# Patient Record
Sex: Female | Born: 1985 | Race: White | Hispanic: No | Marital: Married | Smoking: Former smoker
Health system: Southern US, Community
[De-identification: ages and names within clinical notes are randomized; demographics above are authoritative.]

## PROBLEM LIST (undated history)

## (undated) DIAGNOSIS — Z789 Other specified health status: Secondary | ICD-10-CM

## (undated) HISTORY — PX: KNEE ARTHROSCOPY: SHX127

## (undated) HISTORY — PX: FOOT SURGERY: SHX648

---

## 2006-08-28 ENCOUNTER — Inpatient Hospital Stay: Payer: Self-pay

## 2007-08-08 ENCOUNTER — Emergency Department: Payer: Self-pay | Admitting: Emergency Medicine

## 2008-02-18 ENCOUNTER — Emergency Department: Payer: Self-pay | Admitting: Emergency Medicine

## 2008-05-25 ENCOUNTER — Emergency Department: Payer: Self-pay | Admitting: Emergency Medicine

## 2009-09-13 ENCOUNTER — Emergency Department: Payer: Self-pay | Admitting: Emergency Medicine

## 2009-12-17 ENCOUNTER — Ambulatory Visit: Payer: Self-pay | Admitting: Family Medicine

## 2010-01-14 ENCOUNTER — Encounter: Payer: Self-pay | Admitting: Family Medicine

## 2010-01-18 ENCOUNTER — Encounter: Payer: Self-pay | Admitting: Family Medicine

## 2010-02-17 ENCOUNTER — Encounter: Payer: Self-pay | Admitting: Family Medicine

## 2011-01-03 ENCOUNTER — Ambulatory Visit: Payer: Self-pay | Admitting: Unknown Physician Specialty

## 2011-04-27 ENCOUNTER — Emergency Department: Payer: Self-pay | Admitting: Emergency Medicine

## 2011-05-19 ENCOUNTER — Emergency Department: Payer: Self-pay | Admitting: *Deleted

## 2012-02-26 ENCOUNTER — Emergency Department: Payer: Self-pay | Admitting: *Deleted

## 2013-07-12 ENCOUNTER — Emergency Department: Payer: Self-pay | Admitting: Internal Medicine

## 2013-10-20 NOTE — L&D Delivery Note (Signed)
Delivery Note At 10:36 AM a viable female was delivered via  (Presentation:OP ).  APGAR: 9,9 ; weight pending .   Placenta status: intact spontaneous, no signs of abruption at initial review. .  Cord:  with the following complications: none .  Cord pH: pending  Anesthesia: Epidural  Episiotomy: None Lacerations: None Suture Repair: N/A Est. Blood Loss (mL): 400cc  Mom to postpartum.  Baby to Couplet care / Skin to Skin. Called to delivery. Mother pushed over 5 min. Infant delivered to maternal abdomen. Delayed cord clamping performed. Cord clamped and cut. Active management of 3rd stage with traction and Pitocin. Cord gas drawn. Placenta delivered intact with 3v cord. No tears. EBL 400cc. Counts correct. Hemostatic.    [place], Hillery HunterCaleb G 05/26/2014, 10:52 AM  Evaluation and management procedures were performed by Resident physician under my supervision/collaboration. Chart reviewed, patient examined by me and I agree with management and plan. Cord pHa 7.27 Danae OrleansDeirdre C Agnes Probert, CNM 05/26/2014 1:11 PM

## 2014-01-29 ENCOUNTER — Observation Stay: Payer: Self-pay

## 2014-01-29 LAB — URINALYSIS, COMPLETE
Bilirubin,UR: NEGATIVE
Glucose,UR: NEGATIVE mg/dL (ref 0–75)
Ketone: NEGATIVE
Leukocyte Esterase: NEGATIVE
Nitrite: NEGATIVE
Ph: 6 (ref 4.5–8.0)
Protein: NEGATIVE
RBC,UR: 1 /HPF (ref 0–5)
Specific Gravity: 1.004 (ref 1.003–1.030)
Squamous Epithelial: 1
WBC UR: NONE SEEN /HPF (ref 0–5)

## 2014-01-31 ENCOUNTER — Ambulatory Visit: Payer: Self-pay | Admitting: Obstetrics and Gynecology

## 2014-03-12 ENCOUNTER — Observation Stay: Payer: Self-pay

## 2014-03-12 LAB — URINALYSIS, COMPLETE
BLOOD: NEGATIVE
Bacteria: NONE SEEN
Bilirubin,UR: NEGATIVE
Glucose,UR: NEGATIVE mg/dL (ref 0–75)
Ketone: NEGATIVE
NITRITE: NEGATIVE
Ph: 7 (ref 4.5–8.0)
Protein: NEGATIVE
RBC, UR: NONE SEEN /HPF (ref 0–5)
Specific Gravity: 1.005 (ref 1.003–1.030)
WBC UR: <1 /HPF (ref 0–5)

## 2014-03-12 LAB — FETAL FIBRONECTIN
Appearance: NORMAL
FETAL FIBRONECTIN: NEGATIVE

## 2014-03-23 ENCOUNTER — Observation Stay: Payer: Self-pay

## 2014-03-23 LAB — URINALYSIS, COMPLETE
Bacteria: NONE SEEN
Bilirubin,UR: NEGATIVE
Blood: NEGATIVE
Glucose,UR: NEGATIVE mg/dL (ref 0–75)
Ketone: NEGATIVE
NITRITE: NEGATIVE
PROTEIN: NEGATIVE
Ph: 6 (ref 4.5–8.0)
RBC, UR: NONE SEEN /HPF (ref 0–5)
Specific Gravity: 1.011 (ref 1.003–1.030)
Squamous Epithelial: 1
WBC UR: 1 /HPF (ref 0–5)

## 2014-03-27 ENCOUNTER — Ambulatory Visit (INDEPENDENT_AMBULATORY_CARE_PROVIDER_SITE_OTHER): Payer: 59 | Admitting: Family Medicine

## 2014-03-27 ENCOUNTER — Encounter: Payer: Self-pay | Admitting: Family Medicine

## 2014-03-27 VITALS — BP 103/78 | HR 70 | Ht 67.0 in | Wt 173.0 lb

## 2014-03-27 DIAGNOSIS — Z3480 Encounter for supervision of other normal pregnancy, unspecified trimester: Secondary | ICD-10-CM

## 2014-03-27 DIAGNOSIS — O4410 Placenta previa with hemorrhage, unspecified trimester: Secondary | ICD-10-CM

## 2014-03-27 DIAGNOSIS — O4453 Low lying placenta with hemorrhage, third trimester: Secondary | ICD-10-CM | POA: Insufficient documentation

## 2014-03-27 NOTE — Progress Notes (Signed)
Transfer from [facility]. Has had regular PNC there.  Has low-lying placenta with primary bleed with intial hospitalization. F/u U/s noted placenta remains low-lying (0.6-->0.88).  No further bleeding.  On bedrest, but then allowed to return to work--but struggling to keep up.  Needs note to stay out of work.  Considering returning to light duty in a few weeks. Indications for primary Cesarean delivery discussed--pt. Strongly desires vaginal delivery is possible. Also, recently seen with Preterm contractions, with negative FFN but concerned about further bleeding and possible need for continuous hospitalization. Desires BTL if Cesarean is necessary/needs to sign papers.

## 2014-03-27 NOTE — Patient Instructions (Addendum)
Third Trimester of Pregnancy The third trimester is from week 29 through week 42, months 7 through 9. The third trimester is a time when the fetus is growing rapidly. At the end of the ninth month, the fetus is about 20 inches in length and weighs 6 10 pounds.  BODY CHANGES Your body goes through many changes during pregnancy. The changes vary from woman to woman.   Your weight will continue to increase. You can expect to gain 25 35 pounds (11 16 kg) by the end of the pregnancy.  You may begin to get stretch marks on your hips, abdomen, and breasts.  You may urinate more often because the fetus is moving lower into your pelvis and pressing on your bladder.  You may develop or continue to have heartburn as a result of your pregnancy.  You may develop constipation because certain hormones are causing the muscles that push waste through your intestines to slow down.  You may develop hemorrhoids or swollen, bulging veins (varicose veins).  You may have pelvic pain because of the weight gain and pregnancy hormones relaxing your joints between the bones in your pelvis. Back aches may result from over exertion of the muscles supporting your posture.  Your breasts will continue to grow and be tender. A yellow discharge may leak from your breasts called colostrum.  Your belly button may stick out.  You may feel short of breath because of your expanding uterus.  You may notice the fetus "dropping," or moving lower in your abdomen.  You may have a bloody mucus discharge. This usually occurs a few days to a week before labor begins.  Your cervix becomes thin and soft (effaced) near your due date. WHAT TO EXPECT AT YOUR PRENATAL EXAMS  You will have prenatal exams every 2 weeks until week 36. Then, you will have weekly prenatal exams. During a routine prenatal visit:  You will be weighed to make sure you and the fetus are growing normally.  Your blood pressure is taken.  Your abdomen will  be measured to track your baby's growth.  The fetal heartbeat will be listened to.  Any test results from the previous visit will be discussed.  You may have a cervical check near your due date to see if you have effaced. At around 36 weeks, your caregiver will check your cervix. At the same time, your caregiver will also perform a test on the secretions of the vaginal tissue. This test is to determine if a type of bacteria, Group B streptococcus, is present. Your caregiver will explain this further. Your caregiver may ask you:  What your birth plan is.  How you are feeling.  If you are feeling the baby move.  If you have had any abnormal symptoms, such as leaking fluid, bleeding, severe headaches, or abdominal cramping.  If you have any questions. Other tests or screenings that may be performed during your third trimester include:  Blood tests that check for low iron levels (anemia).  Fetal testing to check the health, activity level, and growth of the fetus. Testing is done if you have certain medical conditions or if there are problems during the pregnancy. FALSE LABOR You may feel small, irregular contractions that eventually go away. These are called Braxton Hicks contractions, or false labor. Contractions may last for hours, days, or even weeks before true labor sets in. If contractions come at regular intervals, intensify, or become painful, it is best to be seen by your caregiver.    SIGNS OF LABOR   Menstrual-like cramps.  Contractions that are 5 minutes apart or less.  Contractions that start on the top of the uterus and spread down to the lower abdomen and back.  A sense of increased pelvic pressure or back pain.  A watery or bloody mucus discharge that comes from the vagina. If you have any of these signs before the 37th week of pregnancy, call your caregiver right away. You need to go to the hospital to get checked immediately. HOME CARE INSTRUCTIONS   Avoid all  smoking, herbs, alcohol, and unprescribed drugs. These chemicals affect the formation and growth of the baby.  Follow your caregiver's instructions regarding medicine use. There are medicines that are either safe or unsafe to take during pregnancy.  Exercise only as directed by your caregiver. Experiencing uterine cramps is a good sign to stop exercising.  Continue to eat regular, healthy meals.  Wear a good support bra for breast tenderness.  Do not use hot tubs, steam rooms, or saunas.  Wear your seat belt at all times when driving.  Avoid raw meat, uncooked cheese, cat litter boxes, and soil used by cats. These carry germs that can cause birth defects in the baby.  Take your prenatal vitamins.  Try taking a stool softener (if your caregiver approves) if you develop constipation. Eat more high-fiber foods, such as fresh vegetables or fruit and whole grains. Drink plenty of fluids to keep your urine clear or pale yellow.  Take warm sitz baths to soothe any pain or discomfort caused by hemorrhoids. Use hemorrhoid cream if your caregiver approves.  If you develop varicose veins, wear support hose. Elevate your feet for 15 minutes, 3 4 times a day. Limit salt in your diet.  Avoid heavy lifting, wear low heal shoes, and practice good posture.  Rest a lot with your legs elevated if you have leg cramps or low back pain.  Visit your dentist if you have not gone during your pregnancy. Use a soft toothbrush to brush your teeth and be gentle when you floss.  A sexual relationship may be continued unless your caregiver directs you otherwise.  Do not travel far distances unless it is absolutely necessary and only with the approval of your caregiver.  Take prenatal classes to understand, practice, and ask questions about the labor and delivery.  Make a trial run to the hospital.  Pack your hospital bag.  Prepare the baby's nursery.  Continue to go to all your prenatal visits as directed  by your caregiver. SEEK MEDICAL CARE IF:  You are unsure if you are in labor or if your water has broken.  You have dizziness.  You have mild pelvic cramps, pelvic pressure, or nagging pain in your abdominal area.  You have persistent nausea, vomiting, or diarrhea.  You have a bad smelling vaginal discharge.  You have pain with urination. SEEK IMMEDIATE MEDICAL CARE IF:   You have a fever.  You are leaking fluid from your vagina.  You have spotting or bleeding from your vagina.  You have severe abdominal cramping or pain.  You have rapid weight loss or gain.  You have shortness of breath with chest pain.  You notice sudden or extreme swelling of your face, hands, ankles, feet, or legs.  You have not felt your baby move in over an hour.  You have severe headaches that do not go away with medicine.  You have vision changes. Document Released: 09/30/2001 Document Revised: 06/08/2013 Document Reviewed:   12/07/2012 [employer] Patient Information 2014 [employer].  Breastfeeding Deciding to breastfeed is one of the best choices you can make for you and your baby. A change in hormones during pregnancy causes your breast tissue to grow and increases the number and size of your milk ducts. These hormones also allow proteins, sugars, and fats from your blood supply to make breast milk in your milk-producing glands. Hormones prevent breast milk from being released before your baby is born as well as prompt milk flow after birth. Once breastfeeding has begun, thoughts of your baby, as well as his or her sucking or crying, can stimulate the release of milk from your milk-producing glands.  BENEFITS OF BREASTFEEDING For Your Baby  Your first milk (colostrum) helps your baby's digestive system function better.   There are antibodies in your milk that help your baby fight off infections.   Your baby has a lower incidence of asthma, allergies, and sudden infant death syndrome.    The nutrients in breast milk are better for your baby than infant formulas and are designed uniquely for your baby's needs.   Breast milk improves your baby's brain development.   Your baby is less likely to develop other conditions, such as childhood obesity, asthma, or type 2 diabetes mellitus.  For You   Breastfeeding helps to create a very special bond between you and your baby.   Breastfeeding is convenient. Breast milk is always available at the correct temperature and costs nothing.   Breastfeeding helps to burn calories and helps you lose the weight gained during pregnancy.   Breastfeeding makes your uterus contract to its prepregnancy size faster and slows bleeding (lochia) after you give birth.   Breastfeeding helps to lower your risk of developing type 2 diabetes mellitus, osteoporosis, and breast or ovarian cancer later in life. SIGNS THAT YOUR BABY IS HUNGRY Early Signs of Hunger  Increased alertness or activity.  Stretching.  Movement of the head from side to side.  Movement of the head and opening of the mouth when the corner of the mouth or cheek is stroked (rooting).  Increased sucking sounds, smacking lips, cooing, sighing, or squeaking.  Hand-to-mouth movements.  Increased sucking of fingers or hands. Late Signs of Hunger  Fussing.  Intermittent crying. Extreme Signs of Hunger Signs of extreme hunger will require calming and consoling before your baby will be able to breastfeed successfully. Do not wait for the following signs of extreme hunger to occur before you initiate breastfeeding:   Restlessness.  A loud, strong cry.   Screaming. BREASTFEEDING BASICS Breastfeeding Initiation  Find a comfortable place to sit or lie down, with your neck and back well supported.  Place a pillow or rolled up blanket under your baby to bring him or her to the level of your breast (if you are seated). Nursing pillows are specially designed to help  support your arms and your baby while you breastfeed.  Make sure that your baby's abdomen is facing your abdomen.   Gently massage your breast. With your fingertips, massage from your chest wall toward your nipple in a circular motion. This encourages milk flow. You may need to continue this action during the feeding if your milk flows slowly.  Support your breast with 4 fingers underneath and your thumb above your nipple. Make sure your fingers are well away from your nipple and your baby's mouth.   Stroke your baby's lips gently with your finger or nipple.   When your baby's mouth is   open wide enough, quickly bring your baby to your breast, placing your entire nipple and as much of the colored area around your nipple (areola) as possible into your baby's mouth.   More areola should be visible above your baby's upper lip than below the lower lip.   Your baby's tongue should be between his or her lower gum and your breast.   Ensure that your baby's mouth is correctly positioned around your nipple (latched). Your baby's lips should create a seal on your breast and be turned out (everted).  It is common for your baby to suck about 2 3 minutes in order to start the flow of breast milk. Latching Teaching your baby how to latch on to your breast properly is very important. An improper latch can cause nipple pain and decreased milk supply for you and poor weight gain in your baby. Also, if your baby is not latched onto your nipple properly, he or she may swallow some air during feeding. This can make your baby fussy. Burping your baby when you switch breasts during the feeding can help to get rid of the air. However, teaching your baby to latch on properly is still the best way to prevent fussiness from swallowing air while breastfeeding. Signs that your baby has successfully latched on to your nipple:    Silent tugging or silent sucking, without causing you pain.   Swallowing heard  between every 3 4 sucks.    Muscle movement above and in front of his or her ears while sucking.  Signs that your baby has not successfully latched on to nipple:   Sucking sounds or smacking sounds from your baby while breastfeeding.  Nipple pain. If you think your baby has not latched on correctly, slip your finger into the corner of your baby's mouth to break the suction and place it between your baby's gums. Attempt breastfeeding initiation again. Signs of Successful Breastfeeding Signs from your baby:   A gradual decrease in the number of sucks or complete cessation of sucking.   Falling asleep.   Relaxation of his or her body.   Retention of a small amount of milk in his or her mouth.   Letting go of your breast by himself or herself. Signs from you:  Breasts that have increased in firmness, weight, and size 1 3 hours after feeding.   Breasts that are softer immediately after breastfeeding.  Increased milk volume, as well as a change in milk consistency and color by the 5th day of breastfeeding.   Nipples that are not sore, cracked, or bleeding. Signs That Your Baby is Getting Enough Milk  Wetting at least 3 diapers in a 24-hour period. The urine should be clear and pale yellow by age 5 days.  At least 3 stools in a 24-hour period by age 5 days. The stool should be soft and yellow.  At least 3 stools in a 24-hour period by age 7 days. The stool should be seedy and yellow.  No loss of weight greater than 10% of birth weight during the first 3 days of age.  Average weight gain of 4 7 ounces (120 210 mL) per week after age 4 days.  Consistent daily weight gain by age 5 days, without weight loss after the age of 2 weeks. After a feeding, your baby may spit up a small amount. This is common. BREASTFEEDING FREQUENCY AND DURATION Frequent feeding will help you make more milk and can prevent sore nipples and breast engorgement.   Breastfeed when you feel the need to  reduce the fullness of your breasts or when your baby shows signs of hunger. This is called "breastfeeding on demand." Avoid introducing a pacifier to your baby while you are working to establish breastfeeding (the first 4 6 weeks after your baby is born). After this time you may choose to use a pacifier. Research has shown that pacifier use during the first year of a baby's life decreases the risk of sudden infant death syndrome (SIDS). Allow your baby to feed on each breast as long as he or she wants. Breastfeed until your baby is finished feeding. When your baby unlatches or falls asleep while feeding from the first breast, offer the second breast. Because newborns are often sleepy in the first few weeks of life, you may need to awaken your baby to get him or her to feed. Breastfeeding times will vary from baby to baby. However, the following rules can serve as a guide to help you ensure that your baby is properly fed:  Newborns (babies 4 weeks of age or younger) may breastfeed every 1 3 hours.  Newborns should not go longer than 3 hours during the day or 5 hours during the night without breastfeeding.  You should breastfeed your baby a minimum of 8 times in a 24-hour period until you begin to introduce solid foods to your baby at around 6 months of age. BREAST MILK PUMPING Pumping and storing breast milk allows you to ensure that your baby is exclusively fed your breast milk, even at times when you are unable to breastfeed. This is especially important if you are going back to work while you are still breastfeeding or when you are not able to be present during feedings. Your lactation consultant can give you guidelines on how long it is safe to store breast milk.  A breast pump is a machine that allows you to pump milk from your breast into a sterile bottle. The pumped breast milk can then be stored in a refrigerator or freezer. Some breast pumps are operated by hand, while others use electricity. Ask  your lactation consultant which type will work best for you. Breast pumps can be purchased, but some hospitals and breastfeeding support groups lease breast pumps on a monthly basis. A lactation consultant can teach you how to hand express breast milk, if you prefer not to use a pump.  CARING FOR YOUR BREASTS WHILE YOU BREASTFEED Nipples can become dry, cracked, and sore while breastfeeding. The following recommendations can help keep your breasts moisturized and healthy:  Avoid using soap on your nipples.   Wear a supportive bra. Although not required, special nursing bras and tank tops are designed to allow access to your breasts for breastfeeding without taking off your entire bra or top. Avoid wearing underwire style bras or extremely tight bras.  Air dry your nipples for 3 4minutes after each feeding.   Use only cotton bra pads to absorb leaked breast milk. Leaking of breast milk between feedings is normal.   Use lanolin on your nipples after breastfeeding. Lanolin helps to maintain your skin's normal moisture barrier. If you use pure lanolin you do not need to wash it off before feeding your baby again. Pure lanolin is not toxic to your baby. You may also hand express a few drops of breast milk and gently massage that milk into your nipples and allow the milk to air dry. In the first few weeks after giving birth, some women   experience extremely full breasts (engorgement). Engorgement can make your breasts feel heavy, warm, and tender to the touch. Engorgement peaks within 3 5 days after you give birth. The following recommendations can help ease engorgement:  Completely empty your breasts while breastfeeding or pumping. You may want to start by applying warm, moist heat (in the shower or with warm water-soaked hand towels) just before feeding or pumping. This increases circulation and helps the milk flow. If your baby does not completely empty your breasts while breastfeeding, pump any extra  milk after he or she is finished.  Wear a snug bra (nursing or regular) or tank top for 1 2 days to signal your body to slightly decrease milk production.  Apply ice packs to your breasts, unless this is too uncomfortable for you.  Make sure that your baby is latched on and positioned properly while breastfeeding. If engorgement persists after 48 hours of following these recommendations, contact your health care provider or a Advertising copywriterlactation consultant. OVERALL HEALTH CARE RECOMMENDATIONS WHILE BREASTFEEDING  Eat healthy foods. Alternate between meals and snacks, eating 3 of each per day. Because what you eat affects your breast milk, some of the foods may make your baby more irritable than usual. Avoid eating these foods if you are sure that they are negatively affecting your baby.  Drink milk, fruit juice, and water to satisfy your thirst (about 10 glasses a day).   Rest often, relax, and continue to take your prenatal vitamins to prevent fatigue, stress, and anemia.  Continue breast self-awareness checks.  Avoid chewing and smoking tobacco.  Avoid alcohol and drug use. Some medicines that may be harmful to your baby can pass through breast milk. It is important to ask your health care provider before taking any medicine, including all over-the-counter and prescription medicine as well as vitamin and herbal supplements. It is possible to become pregnant while breastfeeding. If birth control is desired, ask your health care provider about options that will be safe for your baby. SEEK MEDICAL CARE IF:   You feel like you want to stop breastfeeding or have become frustrated with breastfeeding.  You have painful breasts or nipples.  Your nipples are cracked or bleeding.  Your breasts are red, tender, or warm.  You have a swollen area on either breast.  You have a fever or chills.  You have nausea or vomiting.  You have drainage other than breast milk from your nipples.  Your breasts  do not become full before feedings by the 5th day after you give birth.  You feel sad and depressed.  Your baby is too sleepy to eat well.  Your baby is having trouble sleeping.   Your baby is wetting less than 3 diapers in a 24-hour period.  Your baby has less than 3 stools in a 24-hour period.  Your baby's skin or the white part of his or her eyes becomes yellow.   Your baby is not gaining weight by 635 days of age. SEEK IMMEDIATE MEDICAL CARE IF:   Your baby is overly tired (lethargic) and does not want to wake up and feed.  Your baby develops an unexplained fever. Document Released: 10/06/2005 Document Revised: 06/08/2013 Document Reviewed: 03/30/2013 [facility] Patient Information 2014 [place], [place]. Placenta Previa  Placenta previa is a condition in pregnant women where the placenta implants in the lower part of the uterus. The placenta either partially or completely covers the opening to the cervix. This is a problem because the baby must pass through  the cervix during delivery. There are three types of placenta previa. They include:  1. Marginal placenta previa. The placenta is near the cervix, but does not cover the opening. 2. Partial placenta previa. The placenta covers part of the cervical opening. 3. Complete placenta previa. The placenta covers the entire cervical opening.  Depending on the type of placenta previa, there is a chance the placenta may move into a normal position and no longer cover the cervix as the pregnancy progresses. It is important to keep all prenatal visits with your caregiver.  RISK FACTORS You may be more likely to develop placenta previa if you:   Are carrying more than one baby (multiples).   Have an abnormally shaped uterus.   Have scars on the lining of the uterus.   Had previous surgeries involving the uterus, such as a cesarean delivery.   Have delivered a baby previously.   Have a history of placenta previa.   Have smoked  or used cocaine during pregnancy.   Are age 40 or older during pregnancy.  SYMPTOMS The main symptom of placenta previa is sudden, painless vaginal bleeding during the second half of pregnancy. The amount of bleeding can be light to very heavy. The bleeding may stop on its own, but almost always returns. Cramping, regular contractions, abdominal pain, and lower back pain can also occur with placenta previa.  DIAGNOSIS Placenta previa can be diagnosed through an ultrasound by finding where the placenta is located. The ultrasound may find placenta previa either during a routine prenatal visit or after vaginal bleeding is noticed. If you are diagnosed with placenta previa, your caregiver may avoid vaginal exams to reduce the risk of heavy bleeding. There is a chance that placenta previa may not be diagnosed until bleeding occurs during labor.  TREATMENT Specific treatment depends on:   How much you are bleeding or if the bleeding has stopped.  How far along you are in your pregnancy.   The condition of the baby.   The location of the baby and placenta.   The type of placenta previa.  Depending on the factors above, your caregiver may recommend:   Decreased activity.   Bed rest at home or in the hospital.  Pelvic rest. This means no sex, using tampons, douching, pelvic exams, or placing anything into the vagina.  A blood transfusion to replace maternal blood loss.  A cesarean delivery if the bleeding is heavy and cannot be controlled or the placenta completely covers the cervix.  Medication to stop premature labor or mature the fetal lungs if delivery is needed before the pregnancy is full term.  WHEN SHOULD YOU SEEK IMMEDIATE MEDICAL CARE IF YOU ARE SENT HOME WITH PLACENTA PREVIA? Seek immediate medical care if you show any symptoms of placenta previa. You will need to go to the hospital to get checked immediately. Again, those symptoms are:  Sudden, painless vaginal bleeding,  even a small amount.  Cramping or regular contractions.  Lower back or abdominal pain. Document Released: 10/06/2005 Document Revised: 06/08/2013 Document Reviewed: 01/07/2013 [facility] Patient Information 2014 [place], [place].

## 2014-04-07 ENCOUNTER — Encounter: Payer: Self-pay | Admitting: Obstetrics & Gynecology

## 2014-04-07 ENCOUNTER — Ambulatory Visit (INDEPENDENT_AMBULATORY_CARE_PROVIDER_SITE_OTHER): Payer: 59 | Admitting: Obstetrics & Gynecology

## 2014-04-07 ENCOUNTER — Other Ambulatory Visit: Payer: Self-pay | Admitting: Obstetrics & Gynecology

## 2014-04-07 VITALS — BP 112/85 | HR 76 | Wt 175.0 lb

## 2014-04-07 DIAGNOSIS — O4453 Low lying placenta with hemorrhage, third trimester: Secondary | ICD-10-CM

## 2014-04-07 DIAGNOSIS — N6452 Nipple discharge: Secondary | ICD-10-CM

## 2014-04-07 DIAGNOSIS — O9989 Other specified diseases and conditions complicating pregnancy, childbirth and the puerperium: Secondary | ICD-10-CM

## 2014-04-07 DIAGNOSIS — O26893 Other specified pregnancy related conditions, third trimester: Secondary | ICD-10-CM

## 2014-04-07 DIAGNOSIS — Z3480 Encounter for supervision of other normal pregnancy, unspecified trimester: Secondary | ICD-10-CM

## 2014-04-07 DIAGNOSIS — N898 Other specified noninflammatory disorders of vagina: Secondary | ICD-10-CM

## 2014-04-07 DIAGNOSIS — O4410 Placenta previa with hemorrhage, unspecified trimester: Secondary | ICD-10-CM

## 2014-04-07 DIAGNOSIS — Z3483 Encounter for supervision of other normal pregnancy, third trimester: Secondary | ICD-10-CM

## 2014-04-07 DIAGNOSIS — N6459 Other signs and symptoms in breast: Secondary | ICD-10-CM

## 2014-04-07 NOTE — Patient Instructions (Signed)
Return to clinic for any obstetric concerns or go to [facility] for evaluation  

## 2014-04-07 NOTE — Progress Notes (Signed)
Patient is still having increased yeast like discharge and would like us to check this out.  She is also having increased dizziness.  She has also noticed some bloody discharge from her left nipple.

## 2014-04-07 NOTE — Progress Notes (Signed)
#  Bloody Discharge: Noted on left nipple. Has been going on for a week.  On exam, bloody discharge noted on expression of breast, some colostrum-like fluid also expressed.  No bloody drainage from right breast, just the colostrum-like fluid .  No masses, lymphadenopathy bilaterally. Patient has family history of breast cancer and is concerned. Limited left breast ultrasound ordered, will follow up results and manage accordingly. #Yeast infection: Recently treated with Diflucan, still notes thick, white, nonpruritic discharge which was also noted on exam. Wet prep obtained, will follow up results and manage accordingly.  Discussed normal leukorrhea of pregnancy. #Dizziness: Had normal Hgb at 28 weeks as per patient. Recommended adequate hydration.  No CP, SOB or other associated symptoms. Told this can happen occasionally in pregnancy, but if it gets more concerning, may need further evaluation. #Low-lying placenta: Ordered follow up scan around 34 weeks. Pelvic rest restrictions emphasized; was 0.88 cm from cervix on last scan.  Patient strongly desires SVD. No other complaints or concerns.  Fetal movement and labor precautions reviewed.

## 2014-04-08 LAB — WET PREP, GENITAL
Clue Cells Wet Prep HPF POC: NONE SEEN
Trich, Wet Prep: NONE SEEN
Yeast Wet Prep HPF POC: NONE SEEN

## 2014-04-10 ENCOUNTER — Encounter: Payer: Self-pay | Admitting: Family Medicine

## 2014-04-11 ENCOUNTER — Encounter: Payer: 59 | Admitting: Obstetrics & Gynecology

## 2014-04-14 ENCOUNTER — Ambulatory Visit
Admission: RE | Admit: 2014-04-14 | Discharge: 2014-04-14 | Disposition: A | Discharge status: Home / Self Care | Payer: 59 | Source: Ambulatory Visit | Attending: Obstetrics & Gynecology | Admitting: Obstetrics & Gynecology

## 2014-04-14 DIAGNOSIS — N6452 Nipple discharge: Secondary | ICD-10-CM

## 2014-04-25 ENCOUNTER — Ambulatory Visit (INDEPENDENT_AMBULATORY_CARE_PROVIDER_SITE_OTHER): Payer: 59 | Admitting: Family Medicine

## 2014-04-25 VITALS — BP 121/71 | HR 59 | Wt 176.0 lb

## 2014-04-25 DIAGNOSIS — O4453 Low lying placenta with hemorrhage, third trimester: Secondary | ICD-10-CM

## 2014-04-25 DIAGNOSIS — O4410 Placenta previa with hemorrhage, unspecified trimester: Secondary | ICD-10-CM

## 2014-04-25 DIAGNOSIS — Z3480 Encounter for supervision of other normal pregnancy, unspecified trimester: Secondary | ICD-10-CM

## 2014-04-25 DIAGNOSIS — Z3483 Encounter for supervision of other normal pregnancy, third trimester: Secondary | ICD-10-CM

## 2014-04-25 NOTE — Patient Instructions (Addendum)
Third Trimester of Pregnancy The third trimester is from week 29 through week 42, months 7 through 9. The third trimester is a time when the fetus is growing rapidly. At the end of the ninth month, the fetus is about 20 inches in length and weighs 6-10 pounds.  BODY CHANGES Your body goes through many changes during pregnancy. The changes vary from woman to woman.   Your weight will continue to increase. You can expect to gain 25-35 pounds (11-16 kg) by the end of the pregnancy.  You may begin to get stretch marks on your hips, abdomen, and breasts.  You may urinate more often because the fetus is moving lower into your pelvis and pressing on your bladder.  You may develop or continue to have heartburn as a result of your pregnancy.  You may develop constipation because certain hormones are causing the muscles that push waste through your intestines to slow down.  You may develop hemorrhoids or swollen, bulging veins (varicose veins).  You may have pelvic pain because of the weight gain and pregnancy hormones relaxing your joints between the bones in your pelvis. Backaches may result from overexertion of the muscles supporting your posture.  You may have changes in your hair. These can include thickening of your hair, rapid growth, and changes in texture. Some women also have hair loss during or after pregnancy, or hair that feels dry or thin. Your hair will most likely return to normal after your baby is born.  Your breasts will continue to grow and be tender. A yellow discharge may leak from your breasts called colostrum.  Your belly button may stick out.  You may feel short of breath because of your expanding uterus.  You may notice the fetus "dropping," or moving lower in your abdomen.  You may have a bloody mucus discharge. This usually occurs a few days to a week before labor begins.  Your cervix becomes thin and soft (effaced) near your due date. WHAT TO EXPECT AT YOUR  PRENATAL EXAMS  You will have prenatal exams every 2 weeks until week 36. Then, you will have weekly prenatal exams. During a routine prenatal visit:  You will be weighed to make sure you and the fetus are growing normally.  Your blood pressure is taken.  Your abdomen will be measured to track your baby's growth.  The fetal heartbeat will be listened to.  Any test results from the previous visit will be discussed.  You may have a cervical check near your due date to see if you have effaced. At around 36 weeks, your caregiver will check your cervix. At the same time, your caregiver will also perform a test on the secretions of the vaginal tissue. This test is to determine if a type of bacteria, Group B streptococcus, is present. Your caregiver will explain this further. Your caregiver may ask you:  What your birth plan is.  How you are feeling.  If you are feeling the baby move.  If you have had any abnormal symptoms, such as leaking fluid, bleeding, severe headaches, or abdominal cramping.  If you have any questions. Other tests or screenings that may be performed during your third trimester include:  Blood tests that check for low iron levels (anemia).  Fetal testing to check the health, activity level, and growth of the fetus. Testing is done if you have certain medical conditions or if there are problems during the pregnancy. FALSE LABOR You may feel small, irregular contractions that   eventually go away. These are called Braxton Hicks contractions, or false labor. Contractions may last for hours, days, or even weeks before true labor sets in. If contractions come at regular intervals, intensify, or become painful, it is best to be seen by your caregiver.  SIGNS OF LABOR   Menstrual-like cramps.  Contractions that are 5 minutes apart or less.  Contractions that start on the top of the uterus and spread down to the lower abdomen and back.  A sense of increased pelvic  pressure or back pain.  A watery or bloody mucus discharge that comes from the vagina. If you have any of these signs before the 37th week of pregnancy, call your caregiver right away. You need to go to the hospital to get checked immediately. HOME CARE INSTRUCTIONS   Avoid all smoking, herbs, alcohol, and unprescribed drugs. These chemicals affect the formation and growth of the baby.  Follow your caregiver's instructions regarding medicine use. There are medicines that are either safe or unsafe to take during pregnancy.  Exercise only as directed by your caregiver. Experiencing uterine cramps is a good sign to stop exercising.  Continue to eat regular, healthy meals.  Wear a good support bra for breast tenderness.  Do not use hot tubs, steam rooms, or saunas.  Wear your seat belt at all times when driving.  Avoid raw meat, uncooked cheese, cat litter boxes, and soil used by cats. These carry germs that can cause birth defects in the baby.  Take your prenatal vitamins.  Try taking a stool softener (if your caregiver approves) if you develop constipation. Eat more high-fiber foods, such as fresh vegetables or fruit and whole grains. Drink plenty of fluids to keep your urine clear or pale yellow.  Take warm sitz baths to soothe any pain or discomfort caused by hemorrhoids. Use hemorrhoid cream if your caregiver approves.  If you develop varicose veins, wear support hose. Elevate your feet for 15 minutes, 3-4 times a day. Limit salt in your diet.  Avoid heavy lifting, wear low heal shoes, and practice good posture.  Rest a lot with your legs elevated if you have leg cramps or low back pain.  Visit your dentist if you have not gone during your pregnancy. Use a soft toothbrush to brush your teeth and be gentle when you floss.  A sexual relationship may be continued unless your caregiver directs you otherwise.  Do not travel far distances unless it is absolutely necessary and only  with the approval of your caregiver.  Take prenatal classes to understand, practice, and ask questions about the labor and delivery.  Make a trial run to the hospital.  Pack your hospital bag.  Prepare the baby's nursery.  Continue to go to all your prenatal visits as directed by your caregiver. SEEK MEDICAL CARE IF:  You are unsure if you are in labor or if your water has broken.  You have dizziness.  You have mild pelvic cramps, pelvic pressure, or nagging pain in your abdominal area.  You have persistent nausea, vomiting, or diarrhea.  You have a bad smelling vaginal discharge.  You have pain with urination. SEEK IMMEDIATE MEDICAL CARE IF:   You have a fever.  You are leaking fluid from your vagina.  You have spotting or bleeding from your vagina.  You have severe abdominal cramping or pain.  You have rapid weight loss or gain.  You have shortness of breath with chest pain.  You notice sudden or extreme swelling   of your face, hands, ankles, feet, or legs.  You have not felt your baby move in over an hour.  You have severe headaches that do not go away with medicine.  You have vision changes. Document Released: 09/30/2001 Document Revised: 10/11/2013 Document Reviewed: 12/07/2012 [employer] Patient Information 2015 [employer]. This information is not intended to replace advice given to you by your health care provider. Make sure you discuss any questions you have with your health care provider.  Breastfeeding Deciding to breastfeed is one of the best choices you can make for you and your baby. A change in hormones during pregnancy causes your breast tissue to grow and increases the number and size of your milk ducts. These hormones also allow proteins, sugars, and fats from your blood supply to make breast milk in your milk-producing glands. Hormones prevent breast milk from being released before your baby is born as well as prompt milk flow after birth. Once  breastfeeding has begun, thoughts of your baby, as well as his or her sucking or crying, can stimulate the release of milk from your milk-producing glands.  BENEFITS OF BREASTFEEDING For Your Baby  Your first milk (colostrum) helps your baby's digestive system function better.   There are antibodies in your milk that help your baby fight off infections.   Your baby has a lower incidence of asthma, allergies, and sudden infant death syndrome.   The nutrients in breast milk are better for your baby than infant formulas and are designed uniquely for your baby's needs.   Breast milk improves your baby's brain development.   Your baby is less likely to develop other conditions, such as childhood obesity, asthma, or type 2 diabetes mellitus.  For You   Breastfeeding helps to create a very special bond between you and your baby.   Breastfeeding is convenient. Breast milk is always available at the correct temperature and costs nothing.   Breastfeeding helps to burn calories and helps you lose the weight gained during pregnancy.   Breastfeeding makes your uterus contract to its prepregnancy size faster and slows bleeding (lochia) after you give birth.   Breastfeeding helps to lower your risk of developing type 2 diabetes mellitus, osteoporosis, and breast or ovarian cancer later in life. SIGNS THAT YOUR BABY IS HUNGRY Early Signs of Hunger  Increased alertness or activity.  Stretching.  Movement of the head from side to side.  Movement of the head and opening of the mouth when the corner of the mouth or cheek is stroked (rooting).  Increased sucking sounds, smacking lips, cooing, sighing, or squeaking.  Hand-to-mouth movements.  Increased sucking of fingers or hands. Late Signs of Hunger  Fussing.  Intermittent crying. Extreme Signs of Hunger Signs of extreme hunger will require calming and consoling before your baby will be able to breastfeed successfully. Do not  wait for the following signs of extreme hunger to occur before you initiate breastfeeding:   Restlessness.  A loud, strong cry.   Screaming. BREASTFEEDING BASICS Breastfeeding Initiation  Find a comfortable place to sit or lie down, with your neck and back well supported.  Place a pillow or rolled up blanket under your baby to bring him or her to the level of your breast (if you are seated). Nursing pillows are specially designed to help support your arms and your baby while you breastfeed.  Make sure that your baby's abdomen is facing your abdomen.   Gently massage your breast. With your fingertips, massage from your chest   wall toward your nipple in a circular motion. This encourages milk flow. You may need to continue this action during the feeding if your milk flows slowly.  Support your breast with 4 fingers underneath and your thumb above your nipple. Make sure your fingers are well away from your nipple and your baby's mouth.   Stroke your baby's lips gently with your finger or nipple.   When your baby's mouth is open wide enough, quickly bring your baby to your breast, placing your entire nipple and as much of the colored area around your nipple (areola) as possible into your baby's mouth.   More areola should be visible above your baby's upper lip than below the lower lip.   Your baby's tongue should be between his or her lower gum and your breast.   Ensure that your baby's mouth is correctly positioned around your nipple (latched). Your baby's lips should create a seal on your breast and be turned out (everted).  It is common for your baby to suck about 2-3 minutes in order to start the flow of breast milk. Latching Teaching your baby how to latch on to your breast properly is very important. An improper latch can cause nipple pain and decreased milk supply for you and poor weight gain in your baby. Also, if your baby is not latched onto your nipple properly, he or she  may swallow some air during feeding. This can make your baby fussy. Burping your baby when you switch breasts during the feeding can help to get rid of the air. However, teaching your baby to latch on properly is still the best way to prevent fussiness from swallowing air while breastfeeding. Signs that your baby has successfully latched on to your nipple:    Silent tugging or silent sucking, without causing you pain.   Swallowing heard between every 3-4 sucks.    Muscle movement above and in front of his or her ears while sucking.  Signs that your baby has not successfully latched on to nipple:   Sucking sounds or smacking sounds from your baby while breastfeeding.  Nipple pain. If you think your baby has not latched on correctly, slip your finger into the corner of your baby's mouth to break the suction and place it between your baby's gums. Attempt breastfeeding initiation again. Signs of Successful Breastfeeding Signs from your baby:   A gradual decrease in the number of sucks or complete cessation of sucking.   Falling asleep.   Relaxation of his or her body.   Retention of a small amount of milk in his or her mouth.   Letting go of your breast by himself or herself. Signs from you:  Breasts that have increased in firmness, weight, and size 1-3 hours after feeding.   Breasts that are softer immediately after breastfeeding.  Increased milk volume, as well as a change in milk consistency and color by the fifth day of breastfeeding.   Nipples that are not sore, cracked, or bleeding. Signs That Your Baby is Getting Enough Milk  Wetting at least 3 diapers in a 24-hour period. The urine should be clear and pale yellow by age 5 days.  At least 3 stools in a 24-hour period by age 5 days. The stool should be soft and yellow.  At least 3 stools in a 24-hour period by age 7 days. The stool should be seedy and yellow.  No loss of weight greater than 10% of birth weight  during the first 3   days of age.  Average weight gain of 4-7 ounces (113-198 g) per week after age 4 days.  Consistent daily weight gain by age 5 days, without weight loss after the age of 2 weeks. After a feeding, your baby may spit up a small amount. This is common. BREASTFEEDING FREQUENCY AND DURATION Frequent feeding will help you make more milk and can prevent sore nipples and breast engorgement. Breastfeed when you feel the need to reduce the fullness of your breasts or when your baby shows signs of hunger. This is called "breastfeeding on demand." Avoid introducing a pacifier to your baby while you are working to establish breastfeeding (the first 4-6 weeks after your baby is born). After this time you may choose to use a pacifier. Research has shown that pacifier use during the first year of a baby's life decreases the risk of sudden infant death syndrome (SIDS). Allow your baby to feed on each breast as long as he or she wants. Breastfeed until your baby is finished feeding. When your baby unlatches or falls asleep while feeding from the first breast, offer the second breast. Because newborns are often sleepy in the first few weeks of life, you may need to awaken your baby to get him or her to feed. Breastfeeding times will vary from baby to baby. However, the following rules can serve as a guide to help you ensure that your baby is properly fed:  Newborns (babies 4 weeks of age or younger) may breastfeed every 1-3 hours.  Newborns should not go longer than 3 hours during the day or 5 hours during the night without breastfeeding.  You should breastfeed your baby a minimum of 8 times in a 24-hour period until you begin to introduce solid foods to your baby at around 6 months of age. BREAST MILK PUMPING Pumping and storing breast milk allows you to ensure that your baby is exclusively fed your breast milk, even at times when you are unable to breastfeed. This is especially important if you are  going back to work while you are still breastfeeding or when you are not able to be present during feedings. Your lactation consultant can give you guidelines on how long it is safe to store breast milk.  A breast pump is a machine that allows you to pump milk from your breast into a sterile bottle. The pumped breast milk can then be stored in a refrigerator or freezer. Some breast pumps are operated by hand, while others use electricity. Ask your lactation consultant which type will work best for you. Breast pumps can be purchased, but some hospitals and breastfeeding support groups lease breast pumps on a monthly basis. A lactation consultant can teach you how to hand express breast milk, if you prefer not to use a pump.  CARING FOR YOUR BREASTS WHILE YOU BREASTFEED Nipples can become dry, cracked, and sore while breastfeeding. The following recommendations can help keep your breasts moisturized and healthy:  Avoid using soap on your nipples.   Wear a supportive bra. Although not required, special nursing bras and tank tops are designed to allow access to your breasts for breastfeeding without taking off your entire bra or top. Avoid wearing underwire-style bras or extremely tight bras.  Air dry your nipples for 3-4minutes after each feeding.   Use only cotton bra pads to absorb leaked breast milk. Leaking of breast milk between feedings is normal.   Use lanolin on your nipples after breastfeeding. Lanolin helps to maintain your skin's   normal moisture barrier. If you use pure lanolin, you do not need to wash it off before feeding your baby again. Pure lanolin is not toxic to your baby. You may also hand express a few drops of breast milk and gently massage that milk into your nipples and allow the milk to air dry. In the first few weeks after giving birth, some women experience extremely full breasts (engorgement). Engorgement can make your breasts feel heavy, warm, and tender to the touch.  Engorgement peaks within 3-5 days after you give birth. The following recommendations can help ease engorgement:  Completely empty your breasts while breastfeeding or pumping. You may want to start by applying warm, moist heat (in the shower or with warm water-soaked hand towels) just before feeding or pumping. This increases circulation and helps the milk flow. If your baby does not completely empty your breasts while breastfeeding, pump any extra milk after he or she is finished.  Wear a snug bra (nursing or regular) or tank top for 1-2 days to signal your body to slightly decrease milk production.  Apply ice packs to your breasts, unless this is too uncomfortable for you.  Make sure that your baby is latched on and positioned properly while breastfeeding. If engorgement persists after 48 hours of following these recommendations, contact your health care provider or a lactation consultant. OVERALL HEALTH CARE RECOMMENDATIONS WHILE BREASTFEEDING  Eat healthy foods. Alternate between meals and snacks, eating 3 of each per day. Because what you eat affects your breast milk, some of the foods may make your baby more irritable than usual. Avoid eating these foods if you are sure that they are negatively affecting your baby.  Drink milk, fruit juice, and water to satisfy your thirst (about 10 glasses a day).   Rest often, relax, and continue to take your prenatal vitamins to prevent fatigue, stress, and anemia.  Continue breast self-awareness checks.  Avoid chewing and smoking tobacco.  Avoid alcohol and drug use. Some medicines that may be harmful to your baby can pass through breast milk. It is important to ask your health care provider before taking any medicine, including all over-the-counter and prescription medicine as well as vitamin and herbal supplements. It is possible to become pregnant while breastfeeding. If birth control is desired, ask your health care provider about options that  will be safe for your baby. SEEK MEDICAL CARE IF:   You feel like you want to stop breastfeeding or have become frustrated with breastfeeding.  You have painful breasts or nipples.  Your nipples are cracked or bleeding.  Your breasts are red, tender, or warm.  You have a swollen area on either breast.  You have a fever or chills.  You have nausea or vomiting.  You have drainage other than breast milk from your nipples.  Your breasts do not become full before feedings by the fifth day after you give birth.  You feel sad and depressed.  Your baby is too sleepy to eat well.  Your baby is having trouble sleeping.   Your baby is wetting less than 3 diapers in a 24-hour period.  Your baby has less than 3 stools in a 24-hour period.  Your baby's skin or the white part of his or her eyes becomes yellow.   Your baby is not gaining weight by 5 days of age. SEEK IMMEDIATE MEDICAL CARE IF:   Your baby is overly tired (lethargic) and does not want to wake up and feed.  Your baby   develops an unexplained fever. Document Released: 10/06/2005 Document Revised: 10/11/2013 Document Reviewed: 03/30/2013 [facility] LLCExitCare Patient Information 2015 [place], [place]. This information is not intended to replace advice given to you by your health care provider. Make sure you discuss any questions you have with your health care provider. Depression, Adult Depression refers to feeling sad, low, down in the dumps, blue, gloomy, or empty. In general, there are two kinds of depression: 1. Depression that we all experience from time to time because of upsetting life experiences, including the loss of a job or the ending of a relationship (normal sadness or normal grief). This kind of depression is considered normal, is short lived, and resolves within a few days to 2 weeks. (Depression experienced after the loss of a loved one is called bereavement. Bereavement often lasts longer than 2 weeks but normally gets  better with time.) 2. Clinical depression, which lasts longer than normal sadness or normal grief or interferes with your ability to function at home, at work, and in school. It also interferes with your personal relationships. It affects almost every aspect of your life. Clinical depression is an illness. Symptoms of depression also can be caused by conditions other than normal sadness and grief or clinical depression. Examples of these conditions are listed as follows:  Physical illness--Some physical illnesses, including underactive thyroid gland (hypothyroidism), severe anemia, specific types of cancer, diabetes, uncontrolled seizures, heart and lung problems, strokes, and chronic pain are commonly associated with symptoms of depression.  Side effects of some prescription medicine--In some people, certain types of prescription medicine can cause symptoms of depression.  Substance abuse--Abuse of alcohol and illicit drugs can cause symptoms of depression. SYMPTOMS Symptoms of normal sadness and normal grief include the following:  Feeling sad or crying for short periods of time.  Not caring about anything (apathy).  Difficulty sleeping or sleeping too much.  No longer able to enjoy the things you used to enjoy.  Desire to be by oneself all the time (social isolation).  Lack of energy or motivation.  Difficulty concentrating or remembering.  Change in appetite or weight.  Restlessness or agitation. Symptoms of clinical depression include the same symptoms of normal sadness or normal grief and also the following symptoms:  Feeling sad or crying all the time.  Feelings of guilt or worthlessness.  Feelings of hopelessness or helplessness.  Thoughts of suicide or the desire to harm yourself (suicidal ideation).  Loss of touch with reality (psychotic symptoms). Seeing or hearing things that are not real (hallucinations) or having false beliefs about your life or the people around  you (delusions and paranoia). DIAGNOSIS  The diagnosis of clinical depression usually is based on the severity and duration of the symptoms. Your caregiver also will ask you questions about your medical history and substance use to find out if physical illness, use of prescription medicine, or substance abuse is causing your depression. Your caregiver also may order blood tests. TREATMENT  Typically, normal sadness and normal grief do not require treatment. However, sometimes antidepressant medicine is prescribed for bereavement to ease the depressive symptoms until they resolve. The treatment for clinical depression depends on the severity of your symptoms but typically includes antidepressant medicine, counseling with a mental health professional, or a combination of both. Your caregiver will help to determine what treatment is best for you. Depression caused by physical illness usually goes away with appropriate medical treatment of the illness. If prescription medicine is causing depression, talk with your caregiver about stopping  the medicine, decreasing the dose, or substituting another medicine. Depression caused by abuse of alcohol or illicit drugs abuse goes away with abstinence from these substances. Some adults need professional help in order to stop drinking or using drugs. SEEK IMMEDIATE CARE IF:  You have thoughts about hurting yourself or others.  You lose touch with reality (have psychotic symptoms).  You are taking medicine for depression and have a serious side effect. FOR MORE INFORMATION National Alliance on Mental Illness: www.nami.[employer] National Institute of Mental Health: http://www.maynard.net/www.nimh.nih.gov Document Released: 10/03/2000 Document Revised: 04/06/2012 Document Reviewed: 01/05/2012 [facility] Patient Information 2015 [place], [place]. This information is not intended to replace advice given to you by your health care provider. Make sure you discuss any questions you have with your  health care provider.

## 2014-04-25 NOTE — Progress Notes (Signed)
U/s scheduled for this week S = D Reports feeling depressed--sleeping a lot, crying spells. Offered therapy--information given on meds

## 2014-04-28 ENCOUNTER — Ambulatory Visit (HOSPITAL_COMMUNITY)
Admission: RE | Admit: 2014-04-28 | Discharge: 2014-04-28 | Disposition: A | Discharge status: Home / Self Care | Payer: 59 | Source: Ambulatory Visit | Attending: Obstetrics & Gynecology | Admitting: Obstetrics & Gynecology

## 2014-04-28 ENCOUNTER — Encounter: Payer: Self-pay | Admitting: Obstetrics & Gynecology

## 2014-04-28 DIAGNOSIS — Z3689 Encounter for other specified antenatal screening: Secondary | ICD-10-CM | POA: Insufficient documentation

## 2014-04-28 DIAGNOSIS — O4453 Low lying placenta with hemorrhage, third trimester: Secondary | ICD-10-CM

## 2014-04-28 DIAGNOSIS — O4410 Placenta previa with hemorrhage, unspecified trimester: Secondary | ICD-10-CM | POA: Insufficient documentation

## 2014-05-02 ENCOUNTER — Telehealth: Payer: Self-pay | Admitting: *Deleted

## 2014-05-02 NOTE — Telephone Encounter (Signed)
Patient called to check on her ultrasound results.  Per Dr. Macon LargeAnyanwu her ultrasound is within normal limits her low lying placenta has resolved and she no longer needs to have restrictions in place.

## 2014-05-04 ENCOUNTER — Ambulatory Visit (INDEPENDENT_AMBULATORY_CARE_PROVIDER_SITE_OTHER): Payer: 59 | Admitting: Obstetrics & Gynecology

## 2014-05-04 VITALS — BP 128/87 | HR 78 | Wt 179.0 lb

## 2014-05-04 DIAGNOSIS — Z3480 Encounter for supervision of other normal pregnancy, unspecified trimester: Secondary | ICD-10-CM

## 2014-05-04 DIAGNOSIS — Z3483 Encounter for supervision of other normal pregnancy, third trimester: Secondary | ICD-10-CM

## 2014-05-04 NOTE — Progress Notes (Signed)
Patient is having a lot of crying, feeling angry and anxious, depressed x 3 weeks off and on.  Getting worse.

## 2014-05-04 NOTE — Progress Notes (Signed)
Patient is depressed because she is being charged by her insurance company for being hospitalized for bleeding during pregnancy (had low-lying placenta) and the paperwork sent by previous OB provider did not go into details about why the hospitalization was needed.  No paperwork was sent from the hospital [facility]).  Does not want any medication; just wants our help to get appropriate documentation sent to the company to avoid any further charges/increases in premiums.  Will will obtain hospital records and send on her behalf to see if this helps.    No other obstetric complaints or concerns.  Resolved low lying placenta. Fetal movement and labor precautions reviewed. Pelvic cultures next visit.

## 2014-05-04 NOTE — Patient Instructions (Signed)
Return to clinic for any obstetric concerns or go to [facility] for evaluation  

## 2014-05-10 ENCOUNTER — Encounter: Payer: 59 | Admitting: Obstetrics & Gynecology

## 2014-05-10 ENCOUNTER — Ambulatory Visit (INDEPENDENT_AMBULATORY_CARE_PROVIDER_SITE_OTHER): Payer: 59 | Admitting: Obstetrics & Gynecology

## 2014-05-10 VITALS — BP 106/75 | HR 78 | Wt 181.0 lb

## 2014-05-10 DIAGNOSIS — Z3685 Encounter for antenatal screening for Streptococcus B: Secondary | ICD-10-CM

## 2014-05-10 DIAGNOSIS — Z113 Encounter for screening for infections with a predominantly sexual mode of transmission: Secondary | ICD-10-CM

## 2014-05-10 DIAGNOSIS — Z3483 Encounter for supervision of other normal pregnancy, third trimester: Secondary | ICD-10-CM

## 2014-05-10 DIAGNOSIS — Z3480 Encounter for supervision of other normal pregnancy, unspecified trimester: Secondary | ICD-10-CM

## 2014-05-10 LAB — OB RESULTS CONSOLE GBS: GBS: NEGATIVE

## 2014-05-10 LAB — OB RESULTS CONSOLE GC/CHLAMYDIA
CHLAMYDIA, DNA PROBE: NEGATIVE
Gonorrhea: NEGATIVE

## 2014-05-10 NOTE — Progress Notes (Signed)
Occasional BHC no ROM or bleeding. Difficulty sleeping States she might want one more pregnancy

## 2014-05-10 NOTE — Patient Instructions (Signed)
Third Trimester of Pregnancy The third trimester is from week 29 through week 42, months 7 through 9. The third trimester is a time when the fetus is growing rapidly. At the end of the ninth month, the fetus is about 20 inches in length and weighs 6-10 pounds.  BODY CHANGES Your body goes through many changes during pregnancy. The changes vary from woman to woman.   Your weight will continue to increase. You can expect to gain 25-35 pounds (11-16 kg) by the end of the pregnancy.  You may begin to get stretch marks on your hips, abdomen, and breasts.  You may urinate more often because the fetus is moving lower into your pelvis and pressing on your bladder.  You may develop or continue to have heartburn as a result of your pregnancy.  You may develop constipation because certain hormones are causing the muscles that push waste through your intestines to slow down.  You may develop hemorrhoids or swollen, bulging veins (varicose veins).  You may have pelvic pain because of the weight gain and pregnancy hormones relaxing your joints between the bones in your pelvis. Backaches may result from overexertion of the muscles supporting your posture.  You may have changes in your hair. These can include thickening of your hair, rapid growth, and changes in texture. Some women also have hair loss during or after pregnancy, or hair that feels dry or thin. Your hair will most likely return to normal after your baby is born.  Your breasts will continue to grow and be tender. A yellow discharge may leak from your breasts called colostrum.  Your belly button may stick out.  You may feel short of breath because of your expanding uterus.  You may notice the fetus "dropping," or moving lower in your abdomen.  You may have a bloody mucus discharge. This usually occurs a few days to a week before labor begins.  Your cervix becomes thin and soft (effaced) near your due date. WHAT TO EXPECT AT YOUR PRENATAL  EXAMS  You will have prenatal exams every 2 weeks until week 36. Then, you will have weekly prenatal exams. During a routine prenatal visit:  You will be weighed to make sure you and the fetus are growing normally.  Your blood pressure is taken.  Your abdomen will be measured to track your baby's growth.  The fetal heartbeat will be listened to.  Any test results from the previous visit will be discussed.  You may have a cervical check near your due date to see if you have effaced. At around 36 weeks, your caregiver will check your cervix. At the same time, your caregiver will also perform a test on the secretions of the vaginal tissue. This test is to determine if a type of bacteria, Group B streptococcus, is present. Your caregiver will explain this further. Your caregiver may ask you:  What your birth plan is.  How you are feeling.  If you are feeling the baby move.  If you have had any abnormal symptoms, such as leaking fluid, bleeding, severe headaches, or abdominal cramping.  If you have any questions. Other tests or screenings that may be performed during your third trimester include:  Blood tests that check for low iron levels (anemia).  Fetal testing to check the health, activity level, and growth of the fetus. Testing is done if you have certain medical conditions or if there are problems during the pregnancy. FALSE LABOR You may feel small, irregular contractions that   eventually go away. These are called Braxton Hicks contractions, or false labor. Contractions may last for hours, days, or even weeks before true labor sets in. If contractions come at regular intervals, intensify, or become painful, it is best to be seen by your caregiver.  SIGNS OF LABOR   Menstrual-like cramps.  Contractions that are 5 minutes apart or less.  Contractions that start on the top of the uterus and spread down to the lower abdomen and back.  A sense of increased pelvic pressure or back  pain.  A watery or bloody mucus discharge that comes from the vagina. If you have any of these signs before the 37th week of pregnancy, call your caregiver right away. You need to go to the hospital to get checked immediately. HOME CARE INSTRUCTIONS   Avoid all smoking, herbs, alcohol, and unprescribed drugs. These chemicals affect the formation and growth of the baby.  Follow your caregiver's instructions regarding medicine use. There are medicines that are either safe or unsafe to take during pregnancy.  Exercise only as directed by your caregiver. Experiencing uterine cramps is a good sign to stop exercising.  Continue to eat regular, healthy meals.  Wear a good support bra for breast tenderness.  Do not use hot tubs, steam rooms, or saunas.  Wear your seat belt at all times when driving.  Avoid raw meat, uncooked cheese, cat litter boxes, and soil used by cats. These carry germs that can cause birth defects in the baby.  Take your prenatal vitamins.  Try taking a stool softener (if your caregiver approves) if you develop constipation. Eat more high-fiber foods, such as fresh vegetables or fruit and whole grains. Drink plenty of fluids to keep your urine clear or pale yellow.  Take warm sitz baths to soothe any pain or discomfort caused by hemorrhoids. Use hemorrhoid cream if your caregiver approves.  If you develop varicose veins, wear support hose. Elevate your feet for 15 minutes, 3-4 times a day. Limit salt in your diet.  Avoid heavy lifting, wear low heal shoes, and practice good posture.  Rest a lot with your legs elevated if you have leg cramps or low back pain.  Visit your dentist if you have not gone during your pregnancy. Use a soft toothbrush to brush your teeth and be gentle when you floss.  A sexual relationship may be continued unless your caregiver directs you otherwise.  Do not travel far distances unless it is absolutely necessary and only with the approval  of your caregiver.  Take prenatal classes to understand, practice, and ask questions about the labor and delivery.  Make a trial run to the hospital.  Pack your hospital bag.  Prepare the baby's nursery.  Continue to go to all your prenatal visits as directed by your caregiver. SEEK MEDICAL CARE IF:  You are unsure if you are in labor or if your water has broken.  You have dizziness.  You have mild pelvic cramps, pelvic pressure, or nagging pain in your abdominal area.  You have persistent nausea, vomiting, or diarrhea.  You have a bad smelling vaginal discharge.  You have pain with urination. SEEK IMMEDIATE MEDICAL CARE IF:   You have a fever.  You are leaking fluid from your vagina.  You have spotting or bleeding from your vagina.  You have severe abdominal cramping or pain.  You have rapid weight loss or gain.  You have shortness of breath with chest pain.  You notice sudden or extreme swelling   of your face, hands, ankles, feet, or legs.  You have not felt your baby move in over an hour.  You have severe headaches that do not go away with medicine.  You have vision changes. Document Released: 09/30/2001 Document Revised: 10/11/2013 Document Reviewed: 12/07/2012 ExitCare Patient Information 2015 ExitCare, LLC. This information is not intended to replace advice given to you by your health care provider. Make sure you discuss any questions you have with your health care provider.  

## 2014-05-11 LAB — GC/CHLAMYDIA PROBE AMP
CT Probe RNA: NEGATIVE
GC PROBE AMP APTIMA: NEGATIVE

## 2014-05-12 LAB — CULTURE, STREPTOCOCCUS GRP B W/SUSCEPT

## 2014-05-16 ENCOUNTER — Ambulatory Visit (INDEPENDENT_AMBULATORY_CARE_PROVIDER_SITE_OTHER): Payer: 59 | Admitting: Obstetrics & Gynecology

## 2014-05-16 VITALS — BP 126/70 | HR 80 | Wt 180.0 lb

## 2014-05-16 DIAGNOSIS — Z3480 Encounter for supervision of other normal pregnancy, unspecified trimester: Secondary | ICD-10-CM

## 2014-05-16 DIAGNOSIS — Z3483 Encounter for supervision of other normal pregnancy, third trimester: Secondary | ICD-10-CM

## 2014-05-16 NOTE — Progress Notes (Signed)
GBS neg.  No other complaints or concerns.  Fetal movement and labor precautions reviewed.

## 2014-05-16 NOTE — Patient Instructions (Signed)
Return to clinic for any obstetric concerns or go to [facility] for evaluation  

## 2014-05-17 ENCOUNTER — Telehealth: Payer: Self-pay | Admitting: *Deleted

## 2014-05-17 NOTE — Telephone Encounter (Signed)
Called patient and left message regarding her disability appeal.  [facility] and was told the patient needs to fill out the appeal forms before the process can begin.  I requested that additional forms be faxed to the office so if the patient no longer has the forms we can provide her with them.  They have also faxed over a new attending physician statement.  I need to also get the exact dates that the patient was out of work since her disability began while she was at another office.

## 2014-05-18 ENCOUNTER — Ambulatory Visit (INDEPENDENT_AMBULATORY_CARE_PROVIDER_SITE_OTHER): Payer: 59 | Admitting: Family Medicine

## 2014-05-18 VITALS — BP 121/82 | HR 80 | Wt 181.0 lb

## 2014-05-18 DIAGNOSIS — Z3483 Encounter for supervision of other normal pregnancy, third trimester: Secondary | ICD-10-CM

## 2014-05-18 DIAGNOSIS — Z3480 Encounter for supervision of other normal pregnancy, unspecified trimester: Secondary | ICD-10-CM

## 2014-05-18 DIAGNOSIS — O368190 Decreased fetal movements, unspecified trimester, not applicable or unspecified: Secondary | ICD-10-CM

## 2014-05-18 DIAGNOSIS — O368131 Decreased fetal movements, third trimester, fetus 1: Secondary | ICD-10-CM

## 2014-05-18 NOTE — Progress Notes (Signed)
Pt complains of decreased fetal movement today

## 2014-05-18 NOTE — Patient Instructions (Signed)

## 2014-05-18 NOTE — Progress Notes (Signed)
Reports decreased FM--will check NST. NST reviewed and reactive.

## 2014-05-24 ENCOUNTER — Encounter: Payer: Self-pay | Admitting: Obstetrics and Gynecology

## 2014-05-24 ENCOUNTER — Ambulatory Visit (INDEPENDENT_AMBULATORY_CARE_PROVIDER_SITE_OTHER): Payer: 59 | Admitting: Obstetrics and Gynecology

## 2014-05-24 VITALS — BP 124/75 | HR 54 | Wt 181.0 lb

## 2014-05-24 DIAGNOSIS — Z3483 Encounter for supervision of other normal pregnancy, third trimester: Secondary | ICD-10-CM

## 2014-05-24 DIAGNOSIS — Z3480 Encounter for supervision of other normal pregnancy, unspecified trimester: Secondary | ICD-10-CM

## 2014-05-24 NOTE — Progress Notes (Signed)
Patient is doing well without complaints. FM/labor precautions reviewed 

## 2014-05-24 NOTE — Progress Notes (Signed)
Brown discharge.  

## 2014-05-25 ENCOUNTER — Encounter (HOSPITAL_COMMUNITY): Payer: Self-pay | Admitting: *Deleted

## 2014-05-25 ENCOUNTER — Inpatient Hospital Stay (HOSPITAL_COMMUNITY)
Admission: AD | Admit: 2014-05-25 | Discharge: 2014-05-25 | Disposition: A | Discharge status: Home / Self Care | Payer: 59 | Source: Ambulatory Visit | Attending: Obstetrics & Gynecology | Admitting: Obstetrics & Gynecology

## 2014-05-25 ENCOUNTER — Inpatient Hospital Stay (HOSPITAL_COMMUNITY)
Admission: AD | Admit: 2014-05-25 | Discharge: 2014-05-28 | DRG: 774 | Disposition: A | Discharge status: Home / Self Care | Payer: 59 | Source: Ambulatory Visit | Attending: Obstetrics & Gynecology | Admitting: Obstetrics & Gynecology

## 2014-05-25 DIAGNOSIS — Z803 Family history of malignant neoplasm of breast: Secondary | ICD-10-CM | POA: Diagnosis not present

## 2014-05-25 DIAGNOSIS — Z8041 Family history of malignant neoplasm of ovary: Secondary | ICD-10-CM | POA: Insufficient documentation

## 2014-05-25 DIAGNOSIS — O4693 Antepartum hemorrhage, unspecified, third trimester: Secondary | ICD-10-CM

## 2014-05-25 DIAGNOSIS — Z87891 Personal history of nicotine dependence: Secondary | ICD-10-CM | POA: Insufficient documentation

## 2014-05-25 DIAGNOSIS — O479 False labor, unspecified: Secondary | ICD-10-CM | POA: Insufficient documentation

## 2014-05-25 DIAGNOSIS — O4690 Antepartum hemorrhage, unspecified, unspecified trimester: Secondary | ICD-10-CM | POA: Diagnosis present

## 2014-05-25 DIAGNOSIS — Z3483 Encounter for supervision of other normal pregnancy, third trimester: Secondary | ICD-10-CM

## 2014-05-25 DIAGNOSIS — O4700 False labor before 37 completed weeks of gestation, unspecified trimester: Secondary | ICD-10-CM

## 2014-05-25 HISTORY — DX: Other specified health status: Z78.9

## 2014-05-25 NOTE — Discharge Instructions (Signed)

## 2014-05-25 NOTE — MAU Note (Signed)
Patient states she is having contractions but not sure how far apart they are. Had a gush of reddish fluid at 1200, not sure if any since . Reports good fetal movement.

## 2014-05-25 NOTE — MAU Note (Signed)
Pt was seen earlier today for labor evaluation and was told to come back if bleeding. Pt passed 2 quarter sized blood clots tonight with a small amount of bleeding. Denies leaking of fluid

## 2014-05-25 NOTE — MAU Provider Note (Signed)
  History     CSN: [id number]  Arrival date and time: 05/25/14 1338   First Provider Initiated Contact with Patient 05/25/14 1423      Chief Complaint  Patient presents with  . Labor Eval   HPI  Feeling contractions, some vaginally bleeding, feeling back pain that she thinks are contractions.  Patient is 28 y.o. G3P2002 3557w0d. +FM, denies LOF, vaginal discharge.    No past medical history on file.  Past Surgical History  Procedure Laterality Date  . Foot surgery Right   . Knee arthroscopy Left     Family History  Problem Relation Age of Onset  . Cancer Maternal Aunt     breast,ovarian  . Cancer Maternal Grandmother     ovarian    History  Substance Use Topics  . Smoking status: Former Smoker    Quit date: 09/24/2013  . Smokeless tobacco: Never Used  . Alcohol Use: Yes     Comment: social-stopped with positive UPT    Allergies:  Allergies  Allergen Reactions  . Phenergan [Promethazine Hcl] Itching    Prescriptions prior to admission  Medication Sig Dispense Refill  . calcium carbonate (TUMS - DOSED IN MG ELEMENTAL CALCIUM) 500 MG chewable tablet Chew 2 tablets by mouth as needed for indigestion or heartburn.       . Prenatal Vit-Fe Fumarate-FA (PRENATAL MULTIVITAMIN) TABS tablet Take 1 tablet by mouth daily at 12 noon.        Review of Systems  Constitutional: Negative for chills, weight loss and malaise/fatigue.  Respiratory: Negative for cough and hemoptysis.   Cardiovascular: Negative for palpitations, orthopnea and claudication.  Gastrointestinal: Negative for nausea and vomiting.  Genitourinary: Negative for dysuria and urgency.   Physical Exam   Blood pressure 110/79, pulse 60, temperature 98.6 F (37 C), temperature source Oral, resp. rate 16, height 5' 6.5" (1.689 m), weight 82.01 kg (180 lb 12.8 oz), last menstrual period 09/01/2013, SpO2 99.00%.  Physical Exam  Constitutional: She is oriented to person, place, and time. She appears  well-developed and well-nourished.  HENT:  Head: Normocephalic and atraumatic.  Eyes: Conjunctivae and EOM are normal.  Neck: Normal range of motion.  Cardiovascular: Normal rate.   Respiratory: Effort normal. No respiratory distress.  GI: Soft. Bowel sounds are normal. She exhibits no distension. There is no tenderness.  Genitourinary:  2/80/-3 ==> 3/80/-3 ==> 3/80-3  Musculoskeletal: Normal range of motion. She exhibits no edema.  Neurological: She is alert and oriented to person, place, and time.  Skin: Skin is warm and dry. No erythema.    MAU Course  Procedures  MDM NST reactive  Assessment and Plan  Minimal change from first exam to 2nd exam.  Pt given option to be discharged or walk, she wanted to walk and then be reexamined.  3rd exam was unchanged., labor precautions given, discharge  Suzanne Pace 05/25/2014, 6:24 PM

## 2014-05-26 ENCOUNTER — Inpatient Hospital Stay (HOSPITAL_COMMUNITY): Payer: 59

## 2014-05-26 ENCOUNTER — Inpatient Hospital Stay (HOSPITAL_COMMUNITY): Payer: 59 | Admitting: Anesthesiology

## 2014-05-26 ENCOUNTER — Encounter (HOSPITAL_COMMUNITY): Payer: Self-pay | Admitting: *Deleted

## 2014-05-26 ENCOUNTER — Encounter (HOSPITAL_COMMUNITY): Payer: 59 | Admitting: Anesthesiology

## 2014-05-26 ENCOUNTER — Telehealth: Payer: Self-pay

## 2014-05-26 DIAGNOSIS — O4690 Antepartum hemorrhage, unspecified, unspecified trimester: Secondary | ICD-10-CM | POA: Diagnosis present

## 2014-05-26 DIAGNOSIS — Z87891 Personal history of nicotine dependence: Secondary | ICD-10-CM | POA: Diagnosis not present

## 2014-05-26 LAB — OB RESULTS CONSOLE HEPATITIS B SURFACE ANTIGEN: HEP B S AG: NEGATIVE

## 2014-05-26 LAB — CBC
HCT: 36.6 % (ref 36.0–46.0)
Hemoglobin: 13.2 g/dL (ref 12.0–15.0)
MCH: 31.1 pg (ref 26.0–34.0)
MCHC: 36.1 g/dL — AB (ref 30.0–36.0)
MCV: 86.3 fL (ref 78.0–100.0)
PLATELETS: 139 10*3/uL — AB (ref 150–400)
RBC: 4.24 MIL/uL (ref 3.87–5.11)
RDW: 14.2 % (ref 11.5–15.5)
WBC: 10.9 10*3/uL — ABNORMAL HIGH (ref 4.0–10.5)

## 2014-05-26 LAB — OB RESULTS CONSOLE ABO/RH: RH Type: POSITIVE

## 2014-05-26 LAB — OB RESULTS CONSOLE RUBELLA ANTIBODY, IGM: RUBELLA: IMMUNE

## 2014-05-26 LAB — SAMPLE TO BLOOD BANK

## 2014-05-26 LAB — OB RESULTS CONSOLE RPR: RPR: NONREACTIVE

## 2014-05-26 LAB — OB RESULTS CONSOLE ANTIBODY SCREEN: ANTIBODY SCREEN: NEGATIVE

## 2014-05-26 LAB — RPR

## 2014-05-26 LAB — ABO/RH: ABO/RH(D): O POS

## 2014-05-26 LAB — OB RESULTS CONSOLE HIV ANTIBODY (ROUTINE TESTING): HIV: NONREACTIVE

## 2014-05-26 MED ORDER — LACTATED RINGERS IV SOLN: 500.0000 mL | INTRAVENOUS | Status: DC | PRN

## 2014-05-26 MED ORDER — IBUPROFEN 600 MG PO TABS: 600.0000 mg | ORAL_TABLET | Freq: Four times a day (QID) | ORAL | Status: DC | PRN

## 2014-05-26 MED ORDER — SIMETHICONE 80 MG PO CHEW: 80.0000 mg | CHEWABLE_TABLET | ORAL | Status: DC | PRN

## 2014-05-26 MED ORDER — LIDOCAINE HCL (PF) 1 % IJ SOLN
INTRAMUSCULAR | Status: DC | PRN
  Administered 2014-05-26 (×2): 9 mL

## 2014-05-26 MED ORDER — LANOLIN HYDROUS EX OINT: TOPICAL_OINTMENT | CUTANEOUS | Status: DC | PRN

## 2014-05-26 MED ORDER — FENTANYL 2.5 MCG/ML BUPIVACAINE 1/10 % EPIDURAL INFUSION (WH - ANES)
INTRAMUSCULAR | Status: DC | PRN
  Administered 2014-05-26: 14 mL/h via EPIDURAL

## 2014-05-26 MED ORDER — BENZOCAINE-MENTHOL 20-0.5 % EX AERO
1.0000 "application " | INHALATION_SPRAY | CUTANEOUS | Status: DC | PRN
  Administered 2014-05-26: 1 via TOPICAL
  Filled 2014-05-26: qty 56

## 2014-05-26 MED ORDER — OXYTOCIN 40 UNITS IN LACTATED RINGERS INFUSION - SIMPLE MED
62.5000 mL/h | INTRAVENOUS | Status: DC
  Administered 2014-05-26: 62.5 mL/h via INTRAVENOUS

## 2014-05-26 MED ORDER — OXYCODONE-ACETAMINOPHEN 5-325 MG PO TABS
1.0000 | ORAL_TABLET | ORAL | Status: DC | PRN
  Administered 2014-05-26: 1 via ORAL
  Filled 2014-05-26: qty 1

## 2014-05-26 MED ORDER — LACTATED RINGERS IV SOLN
INTRAVENOUS | Status: DC
  Administered 2014-05-26: 07:00:00 via INTRAVENOUS

## 2014-05-26 MED ORDER — ONDANSETRON HCL 4 MG/2ML IJ SOLN: 4.0000 mg | Freq: Four times a day (QID) | INTRAMUSCULAR | Status: DC | PRN

## 2014-05-26 MED ORDER — EPHEDRINE 5 MG/ML INJ
10.0000 mg | INTRAVENOUS | Status: DC | PRN
  Filled 2014-05-26: qty 2

## 2014-05-26 MED ORDER — PHENYLEPHRINE 40 MCG/ML (10ML) SYRINGE FOR IV PUSH (FOR BLOOD PRESSURE SUPPORT)
80.0000 ug | PREFILLED_SYRINGE | INTRAVENOUS | Status: DC | PRN
  Filled 2014-05-26: qty 10
  Filled 2014-05-26: qty 2

## 2014-05-26 MED ORDER — ONDANSETRON HCL 4 MG PO TABS: 4.0000 mg | ORAL_TABLET | ORAL | Status: DC | PRN

## 2014-05-26 MED ORDER — ACETAMINOPHEN 325 MG PO TABS: 650.0000 mg | ORAL_TABLET | ORAL | Status: DC | PRN

## 2014-05-26 MED ORDER — OXYCODONE-ACETAMINOPHEN 5-325 MG PO TABS: 1.0000 | ORAL_TABLET | ORAL | Status: DC | PRN

## 2014-05-26 MED ORDER — CITRIC ACID-SODIUM CITRATE 334-500 MG/5ML PO SOLN: 30.0000 mL | ORAL | Status: DC | PRN

## 2014-05-26 MED ORDER — ONDANSETRON HCL 4 MG/2ML IJ SOLN: 4.0000 mg | INTRAMUSCULAR | Status: DC | PRN

## 2014-05-26 MED ORDER — SENNOSIDES-DOCUSATE SODIUM 8.6-50 MG PO TABS
2.0000 | ORAL_TABLET | ORAL | Status: DC
  Administered 2014-05-27 (×2): 2 via ORAL
  Filled 2014-05-26 (×2): qty 2

## 2014-05-26 MED ORDER — DIPHENHYDRAMINE HCL 50 MG/ML IJ SOLN: 12.5000 mg | INTRAMUSCULAR | Status: DC | PRN

## 2014-05-26 MED ORDER — ZOLPIDEM TARTRATE 5 MG PO TABS: 5.0000 mg | ORAL_TABLET | Freq: Every evening | ORAL | Status: DC | PRN

## 2014-05-26 MED ORDER — IBUPROFEN 600 MG PO TABS
600.0000 mg | ORAL_TABLET | Freq: Four times a day (QID) | ORAL | Status: DC
Start: 2014-05-26 — End: 2014-05-28
  Administered 2014-05-26 – 2014-05-28 (×8): 600 mg via ORAL
  Filled 2014-05-26 (×8): qty 1

## 2014-05-26 MED ORDER — LACTATED RINGERS IV SOLN
500.0000 mL | Freq: Once | INTRAVENOUS | Status: AC
  Administered 2014-05-26: 1000 mL via INTRAVENOUS

## 2014-05-26 MED ORDER — OXYTOCIN 40 UNITS IN LACTATED RINGERS INFUSION - SIMPLE MED
1.0000 m[IU]/min | INTRAVENOUS | Status: DC
Start: 2014-05-26 — End: 2014-05-28
  Filled 2014-05-26: qty 1000

## 2014-05-26 MED ORDER — LIDOCAINE HCL (PF) 1 % IJ SOLN
30.0000 mL | INTRAMUSCULAR | Status: DC | PRN
  Filled 2014-05-26: qty 30

## 2014-05-26 MED ORDER — DIPHENHYDRAMINE HCL 25 MG PO CAPS: 25.0000 mg | ORAL_CAPSULE | Freq: Four times a day (QID) | ORAL | Status: DC | PRN

## 2014-05-26 MED ORDER — TETANUS-DIPHTH-ACELL PERTUSSIS 5-2.5-18.5 LF-MCG/0.5 IM SUSP: 0.5000 mL | Freq: Once | INTRAMUSCULAR | Status: DC

## 2014-05-26 MED ORDER — OXYTOCIN BOLUS FROM INFUSION
500.0000 mL | INTRAVENOUS | Status: DC
  Administered 2014-05-26: 500 mL via INTRAVENOUS

## 2014-05-26 MED ORDER — PHENYLEPHRINE 40 MCG/ML (10ML) SYRINGE FOR IV PUSH (FOR BLOOD PRESSURE SUPPORT)
80.0000 ug | PREFILLED_SYRINGE | INTRAVENOUS | Status: DC | PRN
  Filled 2014-05-26: qty 2

## 2014-05-26 MED ORDER — TERBUTALINE SULFATE 1 MG/ML IJ SOLN: 0.2500 mg | Freq: Once | INTRAMUSCULAR | Status: AC | PRN

## 2014-05-26 MED ORDER — WITCH HAZEL-GLYCERIN EX PADS: 1.0000 "application " | MEDICATED_PAD | CUTANEOUS | Status: DC | PRN

## 2014-05-26 MED ORDER — PRENATAL MULTIVITAMIN CH
1.0000 | ORAL_TABLET | Freq: Every day | ORAL | Status: DC
  Administered 2014-05-26 – 2014-05-28 (×3): 1 via ORAL
  Filled 2014-05-26 (×3): qty 1

## 2014-05-26 MED ORDER — DIBUCAINE 1 % RE OINT: 1.0000 "application " | TOPICAL_OINTMENT | RECTAL | Status: DC | PRN

## 2014-05-26 MED ORDER — FENTANYL 2.5 MCG/ML BUPIVACAINE 1/10 % EPIDURAL INFUSION (WH - ANES)
14.0000 mL/h | INTRAMUSCULAR | Status: DC | PRN
  Administered 2014-05-26: 14 mL/h via EPIDURAL
  Filled 2014-05-26: qty 125

## 2014-05-26 NOTE — Progress Notes (Signed)
Patient ID: Suzanne SaxJessica Leija, female   DOB: 07/28/1986, 28 y.o.   MRN: [id number]  Pt s/p epidural.  Comfortable  8/100/-2  Pt has 10 x 10 with mild to moderate variability. Early decels recently.  Pt making good change. Continues to have blooding but not heavy. Will make sure peds are aware of suspected abruption.

## 2014-05-26 NOTE — H&P (Signed)
Suzanne Pace is Pace 28 y.o. female presenting for vaginal bleeding in the 3rd trimester. Maternal Medical History:  Reason for admission: Nausea.     28 year old G3P2002 at 7137w1d gestation who presents with complaint of vaginal bleeding. Patient noticed 2-3 small 2cm sized clots at about 2100 hours. Since that time she has noticed Pace small amount of blood on her sanitary pad. No leakage of fluid or discharge. Contractions are very irregular and 3-4 per hour, mild in severity. Positive fetal movement. No pain with urination. No recent sexual intercourse. Checked earlier in the day and was 3cm dilated. No other complaints.  OB History   Grav Para Term Preterm Abortions TAB SAB Ect Mult Living   3 2 2       2      History reviewed. No pertinent past medical history. Past Surgical History  Procedure Laterality Date  . Foot surgery Right   . Knee arthroscopy Left    Family History: family history includes Cancer in her maternal aunt and maternal grandmother. Social History:  reports that she quit smoking about 8 months ago. She has never used smokeless tobacco. She reports that she drinks alcohol. She reports that she does not use illicit drugs.   Prenatal Transfer Tool  Maternal Diabetes: No Genetic Screening: Normal Maternal Ultrasounds/Referrals: Normal Fetal Ultrasounds or other Referrals:  None Maternal Substance Abuse:  No Significant Maternal Medications:  None Significant Maternal Lab Results:  None Other Comments:  None  Review of Systems  Constitutional: Negative for fever and chills.  HENT: Negative for hearing loss.   Eyes: Negative for blurred vision and double vision.  Respiratory: Negative for cough and shortness of breath.   Cardiovascular: Negative for chest pain and palpitations.  Gastrointestinal: Negative for heartburn, nausea, vomiting, abdominal pain, diarrhea and constipation.  Genitourinary: Negative for dysuria and urgency.  Musculoskeletal: Negative for  myalgias.  Skin: Negative for itching and rash.  Neurological: Negative for dizziness, tingling and headaches.  Endo/Heme/Allergies: Does not bruise/bleed easily.    Dilation: 3 Effacement (%): Thick Exam by:: Dr. Zonia KiefStephens Blood pressure 112/74, pulse 77, temperature 98.2 F (36.8 C), temperature source Oral, resp. rate 18, height 5\' 7"  (1.702 m), weight 82.668 kg (182 lb 4 oz), last menstrual period 09/01/2013. Exam Physical Exam  Constitutional: She is oriented to person, place, and time. She appears well-developed and well-nourished.  Cardiovascular: Normal rate and regular rhythm.   Respiratory: Effort normal and breath sounds normal.  GI: Soft. Bowel sounds are normal. There is no tenderness.  Genitourinary:  Blood in vaginal vault, appears to originate from cervical os. No lesions apparent.  Musculoskeletal: She exhibits no edema and no tenderness.  Neurological: She is alert and oriented to person, place, and time.  Skin: Skin is warm and dry.    Prenatal labs: ABO, Rh:   Antibody:   Rubella:   RPR:    HBsAg:    HIV:    GBS:     Assessment/Plan: 28 year old G3P2002 at 6037w1d gestation who presents with complaint of vaginal bleeding. Concerning for placental abruption. FHR shows moderate variability, no accels, very rare isolated decels.   Admit to L&D, will being induction with pitocin. Anesthesia- Epidural GBS negative Breast feeding Undecided contraception  Patient evaluated with Scheryl DarterJames Arnold MD  Suzanne Pace, Suzanne Pace 05/26/2014, 6:20 AM

## 2014-05-26 NOTE — Anesthesia Procedure Notes (Signed)
Epidural Patient location during procedure: OB Start time: 05/26/2014 8:14 AM End time: 05/26/2014 8:18 AM  Staffing Anesthesiologist: Leilani AbleHATCHETT, Shanena Pellegrino  Preanesthetic Checklist Completed: patient identified, surgical consent, pre-op evaluation, timeout performed, IV checked, risks and benefits discussed and monitors and equipment checked  Epidural Patient position: sitting Prep: site prepped and draped and DuraPrep Patient monitoring: continuous pulse ox and blood pressure Approach: midline Location: L3-L4 Injection technique: LOR air  Needle:  Needle type: Tuohy  Needle gauge: 17 G Needle length: 9 cm and 9 Needle insertion depth: 6 cm Catheter type: closed end flexible Catheter size: 19 Gauge Catheter at skin depth: 11 cm Test dose: negative and Other  Assessment Sensory level: T8 Events: blood not aspirated, injection not painful, no injection resistance, negative IV test and no paresthesia  Additional Notes Reason for block:procedure for pain

## 2014-05-26 NOTE — Anesthesia Preprocedure Evaluation (Signed)
Anesthesia Evaluation  Patient identified by MRN, date of birth, ID band Patient awake    Reviewed: Allergy & Precautions, H&P , NPO status , Patient's Chart, lab work & pertinent test results  Airway Mallampati: I TM Distance: >3 FB Neck ROM: full    Dental no notable dental hx.    Pulmonary former smoker,    Pulmonary exam normal       Cardiovascular negative cardio ROS      Neuro/Psych negative neurological ROS  negative psych ROS   GI/Hepatic negative GI ROS, Neg liver ROS,   Endo/Other  negative endocrine ROS  Renal/GU negative Renal ROS     Musculoskeletal   Abdominal Normal abdominal exam  (+)   Peds  Hematology negative hematology ROS (+)   Anesthesia Other Findings   Reproductive/Obstetrics (+) Pregnancy                           Anesthesia Physical Anesthesia Plan  ASA: II  Anesthesia Plan: Epidural   Post-op Pain Management:    Induction:   Airway Management Planned:   Additional Equipment:   Intra-op Plan:   Post-operative Plan:   Informed Consent: I have reviewed the patients History and Physical, chart, labs and discussed the procedure including the risks, benefits and alternatives for the proposed anesthesia with the patient or authorized representative who has indicated his/her understanding and acceptance.     Plan Discussed with:   Anesthesia Plan Comments:         Anesthesia Quick Evaluation  

## 2014-05-26 NOTE — Telephone Encounter (Signed)
[facility] NEEDED RECORDS ON Suzanne Pace. I FAXED ALL RECORDS WE HAD. PATIENT CAME TO US LATE IN HER PREGNANCY. FAXED OVER TO [phone] ATTN: Suzanne Pace. PATIENT ADMITTED TO HOSPITAL.

## 2014-05-26 NOTE — Anesthesia Postprocedure Evaluation (Signed)
  Anesthesia Post-op Note  Anesthesia Post Note  Patient: Suzanne Pace  Procedure(s) Performed: * No procedures listed *  Anesthesia type: Epidural  Patient location: Mother/Baby  Post pain: Pain level controlled  Post assessment: Post-op Vital signs reviewed  Last Vitals:  Filed Vitals:   05/26/14 1710  BP: 106/69  Pulse: 58  Temp: 36.8 C  Resp: 18    Post vital signs: Reviewed  Level of consciousness:alert  Complications: No apparent anesthesia complications

## 2014-05-27 LAB — CBC
HCT: 34.3 % — ABNORMAL LOW (ref 36.0–46.0)
HEMOGLOBIN: 12.4 g/dL (ref 12.0–15.0)
MCH: 31.2 pg (ref 26.0–34.0)
MCHC: 36.2 g/dL — ABNORMAL HIGH (ref 30.0–36.0)
MCV: 86.2 fL (ref 78.0–100.0)
PLATELETS: 130 10*3/uL — AB (ref 150–400)
RBC: 3.98 MIL/uL (ref 3.87–5.11)
RDW: 14.4 % (ref 11.5–15.5)
WBC: 13.3 10*3/uL — AB (ref 4.0–10.5)

## 2014-05-27 LAB — RAPID URINE DRUG SCREEN, HOSP PERFORMED
AMPHETAMINES: NOT DETECTED
Barbiturates: NOT DETECTED
Benzodiazepines: NOT DETECTED
COCAINE: NOT DETECTED
Opiates: NOT DETECTED
TETRAHYDROCANNABINOL: NOT DETECTED

## 2014-05-27 NOTE — Lactation Note (Signed)
This note was copied from the chart of Boy Becky SaxJessica Dahmer. Lactation Consultation Note  Patient Name: Boy Becky SaxJessica Fallin NGEXB'MToday's Date: 05/27/2014 Reason for consult: Follow-up assessment of this mother/baby dyad.  Mom reports that baby is reluctant to open mouth wide but she is expressing ebm on nipple prior to latch and encouraging a wide mouth prior to latch.  Once baby latches and starts strong sucking, she reports no nipple pain throughout feeding or after.  LC encouraged continued cue feedings and to apply expressed milk on nipples before and after feedings.   Maternal Data    Feeding    LATCH Score/Interventions           LATCH score per mom's report=10 although she sometimes needs to adjust baby's lower lip for wider areolar grasp           Lactation Tools Discussed/Used   Signs of proper latch Cue feedings Nipple care  Consult Status Consult Status: Follow-up Date: 05/28/14 Follow-up type: In-patient    Warrick ParisianBryant, Glynda Soliday [facility] 05/27/2014, 8:21 PM

## 2014-05-27 NOTE — Progress Notes (Signed)
Post Partum Day 1 Subjective: no complaints, up ad lib and voiding  Objective: Blood pressure 129/59, pulse 54, temperature 97.9 F (36.6 C), temperature source Oral, resp. rate 18, height 5\' 7"  (1.702 m), weight 182 lb (82.555 kg), last menstrual period 09/01/2013, SpO2 100.00%, unknown if currently breastfeeding.  Physical Exam:  General: alert, cooperative and no distress Lochia: appropriate Uterine Fundus: firm Incision: n/a DVT Evaluation: No evidence of DVT seen on physical exam. Negative Homan's sign.   Recent Labs  05/26/14 0705 05/27/14 0638  HGB 13.2 12.4  HCT 36.6 34.3*    Assessment/Plan: Plan for discharge tomorrow, Breastfeeding, Circumcision prior to discharge and Contraception OCP   LOS: 2 days   Suzanne Elizardo H. 05/27/2014, 7:42 AM

## 2014-05-28 MED ORDER — IBUPROFEN 600 MG PO TABS: 600.0000 mg | ORAL_TABLET | Freq: Four times a day (QID) | ORAL | Status: DC | PRN

## 2014-05-28 MED ORDER — NORETHINDRONE 0.35 MG PO TABS: 1.0000 | ORAL_TABLET | Freq: Every day | ORAL | Status: DC

## 2014-05-28 NOTE — Lactation Note (Signed)
This note was copied from the chart of Suzanne Pace. Lactation Consultation Note: Follow up visit with mom before DC. She reports that baby is latching well with no pain. No questions at present. To call prn  Patient Name: Suzanne Pace UEAVW'U Date: 05/28/2014 Reason for consult: Follow-up assessment   Maternal Data    Feeding Feeding Type: Breast Fed  LATCH Score/Interventions                      Lactation Tools Discussed/Used     Consult Status Consult Status: Complete    Pamelia Hoit 05/28/2014, 9:18 AM

## 2014-05-28 NOTE — Discharge Instructions (Signed)

## 2014-05-28 NOTE — Discharge Summary (Signed)
Obstetric Discharge Summary Reason for Admission: vaginal bleeding (suspected abruption) and fetal decelerations.  Prenatal Procedures: ultrasound Intrapartum Procedures: spontaneous vaginal delivery Postpartum Procedures: none Complications-Operative and Postpartum: none Hemoglobin  Date Value Ref Range Status  05/27/2014 12.4  12.0 - 15.0 g/dL Final     HCT  Date Value Ref Range Status  05/27/2014 34.3* 36.0 - 46.0 % Final    Physical Exam:  Filed Vitals:   05/28/14 0631  BP: 131/67  Pulse: 95  Temp: 98.5 F (36.9 C)  Resp: 18   General: alert, cooperative and appears stated age 34Lochia: appropriate Uterine Fundus: firm Incision: n/a DVT Evaluation: No evidence of DVT seen on physical exam. Negative Homan's sign.  Discharge Diagnoses: Term Pregnancy-delivered  Discharge Information: Date: 05/28/2014 Activity: pelvic rest Diet: routine Medications: Ibuprofen and Micronor Condition: stable Instructions: refer to practice specific booklet Discharge to: home Follow-up Information   [facility].   Specialty:  Obstetrics and Gynecology   Contact information:   [address] [place] [id number] [phone]      Newborn Data: Live born female  Birth Weight: 6 lb 2 oz (2778 g) APGAR: 9, 9  Home with mother.  Rochele PagesKARIM, Luanna Weesner N 05/28/2014, 8:57 AM

## 2014-05-28 NOTE — Discharge Summary (Signed)
Attestation of Attending Supervision of Advanced Practitioner (CNM/NP): Evaluation and management procedures were performed by the Advanced Practitioner under my supervision and collaboration. I have reviewed the Advanced Practitioner's note and chart, and I agree with the management and plan.  LEGGETT,KELLY H. 11:13 AM   

## 2014-06-01 ENCOUNTER — Telehealth: Payer: Self-pay | Admitting: *Deleted

## 2014-06-01 ENCOUNTER — Encounter: Payer: 59 | Admitting: Obstetrics & Gynecology

## 2014-06-01 DIAGNOSIS — N390 Urinary tract infection, site not specified: Secondary | ICD-10-CM

## 2014-06-01 MED ORDER — NITROFURANTOIN MONOHYD MACRO 100 MG PO CAPS: 100.0000 mg | ORAL_CAPSULE | Freq: Two times a day (BID) | ORAL | Status: DC

## 2014-06-01 NOTE — Telephone Encounter (Signed)
Duplicate chart/tn 

## 2014-06-01 NOTE — Telephone Encounter (Signed)
Patient is having burning with urination.  Every time that she has a catheter inserted she has had the same issue.  We will call in an antibiotic for her and if she continues to have problems she will call back to make an appointment.

## 2014-06-01 NOTE — Telephone Encounter (Signed)
Duplicate chart/tn

## 2014-06-27 ENCOUNTER — Ambulatory Visit: Payer: 59 | Admitting: Obstetrics & Gynecology

## 2014-06-27 DIAGNOSIS — N760 Acute vaginitis: Secondary | ICD-10-CM

## 2014-07-04 ENCOUNTER — Encounter: Payer: Self-pay | Admitting: Family Medicine

## 2014-07-04 ENCOUNTER — Ambulatory Visit (INDEPENDENT_AMBULATORY_CARE_PROVIDER_SITE_OTHER): Payer: 59 | Admitting: Family Medicine

## 2014-07-04 NOTE — Patient Instructions (Signed)
Depression Depression refers to feeling sad, low, down in the dumps, blue, gloomy, or empty. In general, there are two kinds of depression: 1. Normal sadness or normal grief. This kind of depression is one that we all feel from time to time after upsetting life experiences, such as the loss of a job or the ending of a relationship. This kind of depression is considered normal, is short lived, and resolves within a few days to 2 weeks. Depression experienced after the loss of a loved one (bereavement) often lasts longer than 2 weeks but normally gets better with time. 2. Clinical depression. This kind of depression lasts longer than normal sadness or normal grief or interferes with your ability to function at home, at work, and in school. It also interferes with your personal relationships. It affects almost every aspect of your life. Clinical depression is an illness. Symptoms of depression can also be caused by conditions other than those mentioned above, such as:  Physical illness. Some physical illnesses, including underactive thyroid gland (hypothyroidism), severe anemia, specific types of cancer, diabetes, uncontrolled seizures, heart and lung problems, strokes, and chronic pain are commonly associated with symptoms of depression.  Side effects of some prescription medicine. In some people, certain types of medicine can cause symptoms of depression.  Substance abuse. Abuse of alcohol and illicit drugs can cause symptoms of depression. SYMPTOMS Symptoms of normal sadness and normal grief include the following:  Feeling sad or crying for short periods of time.  Not caring about anything (apathy).  Difficulty sleeping or sleeping too much.  No longer able to enjoy the things you used to enjoy.  Desire to be by oneself all the time (social isolation).  Lack of energy or motivation.  Difficulty concentrating or remembering.  Change in appetite or weight.  Restlessness or  agitation. Symptoms of clinical depression include the same symptoms of normal sadness or normal grief and also the following symptoms:  Feeling sad or crying all the time.  Feelings of guilt or worthlessness.  Feelings of hopelessness or helplessness.  Thoughts of suicide or the desire to harm yourself (suicidal ideation).  Loss of touch with reality (psychotic symptoms). Seeing or hearing things that are not real (hallucinations) or having false beliefs about your life or the people around you (delusions and paranoia). DIAGNOSIS  The diagnosis of clinical depression is usually based on how bad the symptoms are and how long they have lasted. Your health care provider will also ask you questions about your medical history and substance use to find out if physical illness, use of prescription medicine, or substance abuse is causing your depression. Your health care provider may also order blood tests. TREATMENT  Often, normal sadness and normal grief do not require treatment. However, sometimes antidepressant medicine is given for bereavement to ease the depressive symptoms until they resolve. The treatment for clinical depression depends on how bad the symptoms are but often includes antidepressant medicine, counseling with a mental health professional, or both. Your health care provider will help to determine what treatment is best for you. Depression caused by physical illness usually goes away with appropriate medical treatment of the illness. If prescription medicine is causing depression, talk with your health care provider about stopping the medicine, decreasing the dose, or changing to another medicine. Depression caused by the abuse of alcohol or illicit drugs goes away when you stop using these substances. Some adults need professional help in order to stop drinking or using drugs. SEEK IMMEDIATE MEDICAL   CARE IF:  You have thoughts about hurting yourself or others.  You lose touch  with reality (have psychotic symptoms).  You are taking medicine for depression and have a serious side effect. FOR MORE INFORMATION  National Alliance on Mental Illness: www.nami.[employer] of Mental Health: http://www.maynard.net/ Document Released: 10/03/2000 Document Revised: 02/20/2014 Document Reviewed: 01/05/2012 [facility] Patient Information 2015 [place], [place]. This information is not intended to replace advice given to you by your health care provider. Make sure you discuss any questions you have with your health care provider. Contraception Choices Contraception (birth control) is the use of any methods or devices to prevent pregnancy. Below are some methods to help avoid pregnancy. HORMONAL METHODS   Contraceptive implant. This is a thin, plastic tube containing progesterone hormone. It does not contain estrogen hormone. Your health care provider inserts the tube in the inner part of the upper arm. The tube can remain in place for up to 3 years. After 3 years, the implant must be removed. The implant prevents the ovaries from releasing an egg (ovulation), thickens the cervical mucus to prevent sperm from entering the uterus, and thins the lining of the inside of the uterus.  Progesterone-only injections. These injections are given every 3 months by your health care provider to prevent pregnancy. This synthetic progesterone hormone stops the ovaries from releasing eggs. It also thickens cervical mucus and changes the uterine lining. This makes it harder for sperm to survive in the uterus.  Birth control pills. These pills contain estrogen and progesterone hormone. They work by preventing the ovaries from releasing eggs (ovulation). They also cause the cervical mucus to thicken, preventing the sperm from entering the uterus. Birth control pills are prescribed by a health care provider.Birth control pills can also be used to treat heavy periods.  Minipill. This type of birth  control pill contains only the progesterone hormone. They are taken every day of each month and must be prescribed by your health care provider.  Birth control patch. The patch contains hormones similar to those in birth control pills. It must be changed once a week and is prescribed by a health care provider.  Vaginal ring. The ring contains hormones similar to those in birth control pills. It is left in the vagina for 3 weeks, removed for 1 week, and then a new one is put back in place. The patient must be comfortable inserting and removing the ring from the vagina.A health care provider's prescription is necessary.  Emergency contraception. Emergency contraceptives prevent pregnancy after unprotected sexual intercourse. This pill can be taken right after sex or up to 5 days after unprotected sex. It is most effective the sooner you take the pills after having sexual intercourse. Most emergency contraceptive pills are available without a prescription. Check with your pharmacist. Do not use emergency contraception as your only form of birth control. BARRIER METHODS   Female condom. This is a thin sheath (latex or rubber) that is worn over the penis during sexual intercourse. It can be used with spermicide to increase effectiveness.  Female condom. This is a soft, loose-fitting sheath that is put into the vagina before sexual intercourse.  Diaphragm. This is a soft, latex, dome-shaped barrier that must be fitted by a health care provider. It is inserted into the vagina, along with a spermicidal jelly. It is inserted before intercourse. The diaphragm should be left in the vagina for 6 to 8 hours after intercourse.  Cervical cap. This is a round, soft,  latex or plastic cup that fits over the cervix and must be fitted by a health care provider. The cap can be left in place for up to 48 hours after intercourse.  Sponge. This is a soft, circular piece of polyurethane foam. The sponge has spermicide in it.  It is inserted into the vagina after wetting it and before sexual intercourse.  Spermicides. These are chemicals that kill or block sperm from entering the cervix and uterus. They come in the form of creams, jellies, suppositories, foam, or tablets. They do not require a prescription. They are inserted into the vagina with an applicator before having sexual intercourse. The process must be repeated every time you have sexual intercourse. INTRAUTERINE CONTRACEPTION  Intrauterine device (IUD). This is a T-shaped device that is put in a woman's uterus during a menstrual period to prevent pregnancy. There are 2 types:  Copper IUD. This type of IUD is wrapped in copper wire and is placed inside the uterus. Copper makes the uterus and fallopian tubes produce a fluid that kills sperm. It can stay in place for 10 years.  Hormone IUD. This type of IUD contains the hormone progestin (synthetic progesterone). The hormone thickens the cervical mucus and prevents sperm from entering the uterus, and it also thins the uterine lining to prevent implantation of a fertilized egg. The hormone can weaken or kill the sperm that get into the uterus. It can stay in place for 3-5 years, depending on which type of IUD is used. PERMANENT METHODS OF CONTRACEPTION  Female tubal ligation. This is when the woman's fallopian tubes are surgically sealed, tied, or blocked to prevent the egg from traveling to the uterus.  Hysteroscopic sterilization. This involves placing a small coil or insert into each fallopian tube. Your doctor uses a technique called hysteroscopy to do the procedure. The device causes scar tissue to form. This results in permanent blockage of the fallopian tubes, so the sperm cannot fertilize the egg. It takes about 3 months after the procedure for the tubes to become blocked. You must use another form of birth control for these 3 months.  Female sterilization. This is when the female has the tubes that carry sperm  tied off (vasectomy).This blocks sperm from entering the vagina during sexual intercourse. After the procedure, the man can still ejaculate fluid (semen). NATURAL PLANNING METHODS  Natural family planning. This is not having sexual intercourse or using a barrier method (condom, diaphragm, cervical cap) on days the woman could become pregnant.  Calendar method. This is keeping track of the length of each menstrual cycle and identifying when you are fertile.  Ovulation method. This is avoiding sexual intercourse during ovulation.  Symptothermal method. This is avoiding sexual intercourse during ovulation, using a thermometer and ovulation symptoms.  Post-ovulation method. This is timing sexual intercourse after you have ovulated. Regardless of which type or method of contraception you choose, it is important that you use condoms to protect against the transmission of sexually transmitted infections (STIs). Talk with your health care provider about which form of contraception is most appropriate for you. Document Released: 10/06/2005 Document Revised: 10/11/2013 Document Reviewed: 03/31/2013 [facility] Patient Information 2015 [place], [place]. This information is not intended to replace advice given to you by your health care provider. Make sure you discuss any questions you have with your health care provider.

## 2014-07-04 NOTE — Progress Notes (Signed)
  Subjective:     Suzanne Pace is a 28 y.o. female who presents for a postpartum visit. She is 6 weeks postpartum following a spontaneous vaginal delivery. I have fully reviewed the prenatal and intrapartum course. The delivery was at 38 gestational weeks. Outcome: spontaneous vaginal delivery. Anesthesia: epidural. Postpartum course has been unremarkable. Baby's course has been uncomplicated. Baby is feeding by breast. Bleeding no bleeding. Bowel function is normal. Bladder function is normal. Patient is not sexually active. Contraception method is none. Postpartum depression screening: positive.  The following portions of the patient's history were reviewed and updated as appropriate: allergies, current medications, past family history, past medical history, past social history, past surgical history and problem list.  Review of Systems Pertinent items are noted in HPI.   Objective:    BP 118/90  Pulse 55  Ht  (1.702 m)  Wt 162 lb (73.483 kg)  BMI 25.37 kg/m2  Breastfeeding? Yes  General:  alert, cooperative and appears stated age   Breasts:  inspection negative, no nipple discharge or bleeding, no masses or nodularity palpable   Vulva:  normal  Vagina: normal vagina  Cervix:  multiparous appearance  Corpus: normal size, contour, position, consistency, mobility, non-tender  Adnexa:  normal adnexa        Assessment:     6 wk postpartum exam. Pap smear not done at today's visit.  Some depression possibly related to parents divorce.  Plan:    1. Contraception: oral progesterone-only contraceptive considering Mirena 2. Pap in next few years 3. Follow up in: 2 months or as needed 4. Warning signs for depression reviewed.  Pt. Declines medication at this time. Offered counseling, she declinesthis for now as well.Marland Kitchen

## 2014-07-20 ENCOUNTER — Ambulatory Visit (INDEPENDENT_AMBULATORY_CARE_PROVIDER_SITE_OTHER): Payer: 59 | Admitting: Obstetrics & Gynecology

## 2014-07-20 ENCOUNTER — Encounter: Payer: Self-pay | Admitting: Obstetrics & Gynecology

## 2014-07-20 VITALS — BP 126/85 | HR 52 | Ht 67.0 in | Wt 162.0 lb

## 2014-07-20 DIAGNOSIS — Z30430 Encounter for insertion of intrauterine contraceptive device: Secondary | ICD-10-CM

## 2014-07-20 DIAGNOSIS — Z01812 Encounter for preprocedural laboratory examination: Secondary | ICD-10-CM

## 2014-07-20 LAB — POCT URINE PREGNANCY: Preg Test, Ur: NEGATIVE

## 2014-07-20 NOTE — Progress Notes (Signed)
Patient ID: Rolley SimsJessica D Egelston, female   DOB: 07/10/1986, 28 y.o.   MRN: [id number] GYNECOLOGY CLINIC PROCEDURE NOTE  Rolley SimsJessica D Monica is a 28 y.o. [phone] here for Mirena IUD insertion. No GYN concerns.    IUD Insertion Procedure Note Patient identified, informed consent performed.  Discussed risks of irregular bleeding, cramping, infection, malpositioning or misplacement of the IUD outside the uterus which may require further procedures. Time out was performed.  Urine pregnancy test negative.  Speculum placed in the vagina.  Cervix visualized.  Cleaned with Betadine x 2.  Grasped anteriorly with a single tooth tenaculum.  Uterus sounded to 8 cm.  Mirena IUD placed per manufacturer's recommendations.  Strings trimmed to 3 cm. Tenaculum was removed, good hemostasis noted.  Patient tolerated procedure well.   Patient was given post-procedure instructions.  Follow up in 4 weeks for IUD check.

## 2014-07-20 NOTE — Patient Instructions (Signed)
Levonorgestrel intrauterine device (IUD) What is this medicine? LEVONORGESTREL IUD (LEE voe nor jes trel) is a contraceptive (birth control) device. The device is placed inside the uterus by a healthcare professional. It is used to prevent pregnancy and can also be used to treat heavy bleeding that occurs during your period. Depending on the device, it can be used for 3 to 5 years. This medicine may be used for other purposes; ask your health care provider or pharmacist if you have questions. COMMON BRAND NAME(S): LILETTA, Mirena, Skyla What should I tell my health care provider before I take this medicine? They need to know if you have any of these conditions: -abnormal Pap smear -cancer of the breast, uterus, or cervix -diabetes -endometritis -genital or pelvic infection now or in the past -have more than one sexual partner or your partner has more than one partner -heart disease -history of an ectopic or tubal pregnancy -immune system problems -IUD in place -liver disease or tumor -problems with blood clots or take blood-thinners -use intravenous drugs -uterus of unusual shape -vaginal bleeding that has not been explained -an unusual or allergic reaction to levonorgestrel, other hormones, silicone, or polyethylene, medicines, foods, dyes, or preservatives -pregnant or trying to get pregnant -breast-feeding How should I use this medicine? This device is placed inside the uterus by a health care professional. Talk to your pediatrician regarding the use of this medicine in children. Special care may be needed. Overdosage: If you think you have taken too much of this medicine contact a poison control center or emergency room at once. NOTE: This medicine is only for you. Do not share this medicine with others. What if I miss a dose? This does not apply. What may interact with this medicine? Do not take this medicine with any of the following  medications: -amprenavir -bosentan -fosamprenavir This medicine may also interact with the following medications: -aprepitant -barbiturate medicines for inducing sleep or treating seizures -bexarotene -griseofulvin -medicines to treat seizures like carbamazepine, ethotoin, felbamate, oxcarbazepine, phenytoin, topiramate -modafinil -pioglitazone -rifabutin -rifampin -rifapentine -some medicines to treat HIV infection like atazanavir, indinavir, lopinavir, nelfinavir, tipranavir, ritonavir -St. John's wort -warfarin This list may not describe all possible interactions. Give your health care provider a list of all the medicines, herbs, non-prescription drugs, or dietary supplements you use. Also tell them if you smoke, drink alcohol, or use illegal drugs. Some items may interact with your medicine. What should I watch for while using this medicine? Visit your doctor or health care professional for regular check ups. See your doctor if you or your partner has sexual contact with others, becomes HIV positive, or gets a sexual transmitted disease. This product does not protect you against HIV infection (AIDS) or other sexually transmitted diseases. You can check the placement of the IUD yourself by reaching up to the top of your vagina with clean fingers to feel the threads. Do not pull on the threads. It is a good habit to check placement after each menstrual period. Call your doctor right away if you feel more of the IUD than just the threads or if you cannot feel the threads at all. The IUD may come out by itself. You may become pregnant if the device comes out. If you notice that the IUD has come out use a backup birth control method like condoms and call your health care provider. Using tampons will not change the position of the IUD and are okay to use during your period. What side effects may   I notice from receiving this medicine? Side effects that you should report to your doctor or  health care professional as soon as possible: -allergic reactions like skin rash, itching or hives, swelling of the face, lips, or tongue -fever, flu-like symptoms -genital sores -high blood pressure -no menstrual period for 6 weeks during use -pain, swelling, warmth in the leg -pelvic pain or tenderness -severe or sudden headache -signs of pregnancy -stomach cramping -sudden shortness of breath -trouble with balance, talking, or walking -unusual vaginal bleeding, discharge -yellowing of the eyes or skin Side effects that usually do not require medical attention (report to your doctor or health care professional if they continue or are bothersome): -acne -breast pain -change in sex drive or performance -changes in weight -cramping, dizziness, or faintness while the device is being inserted -headache -irregular menstrual bleeding within first 3 to 6 months of use -nausea This list may not describe all possible side effects. Call your doctor for medical advice about side effects. You may report side effects to FDA at 1-800-FDA-1088. Where should I keep my medicine? This does not apply. NOTE: This sheet is a summary. It may not cover all possible information. If you have questions about this medicine, talk to your doctor, pharmacist, or health care provider.  2015, Elsevier/Gold Standard. (2011-11-06 13:54:04)  

## 2014-08-15 ENCOUNTER — Ambulatory Visit (INDEPENDENT_AMBULATORY_CARE_PROVIDER_SITE_OTHER): Payer: 59 | Admitting: Obstetrics & Gynecology

## 2014-08-15 ENCOUNTER — Encounter: Payer: Self-pay | Admitting: Obstetrics & Gynecology

## 2014-08-15 VITALS — BP 131/97 | HR 81 | Wt 159.0 lb

## 2014-08-15 DIAGNOSIS — O906 Postpartum mood disturbance: Secondary | ICD-10-CM

## 2014-08-15 DIAGNOSIS — O99345 Other mental disorders complicating the puerperium: Principal | ICD-10-CM

## 2014-08-15 DIAGNOSIS — Z3049 Encounter for surveillance of other contraceptives: Secondary | ICD-10-CM

## 2014-08-15 DIAGNOSIS — F53 Postpartum depression: Secondary | ICD-10-CM

## 2014-08-15 MED ORDER — ESCITALOPRAM OXALATE 20 MG PO TABS: 20.0000 mg | ORAL_TABLET | Freq: Every day | ORAL | Status: DC

## 2014-08-15 NOTE — Progress Notes (Signed)
   Subjective:    Patient ID: Suzanne SimsJessica D Pace, female    DOB: 02/11/1986, 28 y.o.   MRN: [id number]  HPI 28 yo M AA P2 here for a string check. She had a Mirena placed about 3 weeks ago and is experiencing only a little irregular bleeding and no pain.   Her only complaint today is that of feeling anxious and very "snappy". She was experiencing a lot of crying, but that has resolved and now she is more pissy towards others. She denies HI, SI. She has some trouble sleeping.   Review of Systems     Objective:   Physical Exam Her Mirena strings are visible.       Assessment & Plan:  Contraception- Mirena PP Depression- After a discussion, she will start lexapro and RTC in 3 weeks/prn sooner

## 2014-08-21 ENCOUNTER — Encounter: Payer: Self-pay | Admitting: Obstetrics & Gynecology

## 2014-08-24 ENCOUNTER — Ambulatory Visit: Payer: 59 | Admitting: Psychology

## 2014-09-05 ENCOUNTER — Ambulatory Visit: Payer: 59 | Admitting: Family Medicine

## 2015-02-27 NOTE — H&P (Signed)
L&D Evaluation:  History:  HPI 28yo MBF presents via EMS with c/o sudden onset painless bright red vaginal bleeding at 0430am; G3P2002, EDC 06/08/14 with EGA [redacted]w[redacted]d; pregnancy complicated by hyperemesis, HA, vertigo &  LLP 0.6cm from cervix. Been OOW x 3 weeks. Denies intercourse X 1 week, or anything different in last 48h.   Presents with vaginal bleeding   Patient's Medical History No Chronic Illness   Patient's Surgical History none   Medications Pre Natal Vitamins  Tylenol (Acetaminophen)  Meclizine; Zofran   Allergies NKDA   Social History none   Family History Non-Contributory   ROS:  ROS All systems were reviewed.  HEENT, CNS, GI, GU, Respiratory, CV, Renal and Musculoskeletal systems were found to be normal.   Exam:  Vital Signs stable   Urine Protein negative dipstick   General no apparent distress   Mental Status clear   Chest clear   Heart normal sinus rhythm   Abdomen nontender and soft to palpation   Estimated Fetal Weight Average for gestational age   Fundal Height 22   Back no CVAT   Edema no edema   Pelvic not examined   Mebranes Intact   Description bloody, dark red scant blood noted on pad   FHT normal rate with no decels   Fetal Heart Rate 145   Ucx absent   Skin dry   Impression:  Impression LLP with bleeding at 239w3d   Plan:  Plan UA, EFM/NST, monitor contractions and for cervical change, fluids   Electronic Signatures: Ines BloomerBurr, Melody N (CNM)  (Signed 12-Apr-15 08:31)  Authored: L&D Evaluation   Last Updated: 12-Apr-15 08:31 by Ulyses AmorBurr, Melody N (CNM)

## 2015-06-01 IMAGING — CT CT HEAD WITHOUT CONTRAST
2 series · 16 of 30 positions shown, 20 images · non-contrast
Comparison: none

REASON FOR EXAM: "worst headache ever"; facial numbness on right side
COMMENTS:

PROCEDURE:     CT  - CT HEAD WITHOUT CONTRAST  - July 12, 2013  [DATE]
RESULT:     History: Headache.
Comparison Study: No prior.

[Series 2: without · axial · non-contrast · 0.41mm/px · z∈[-148,-28]mm · 13 of 30 slices shown, 17 images]
[im 3/30  brain]
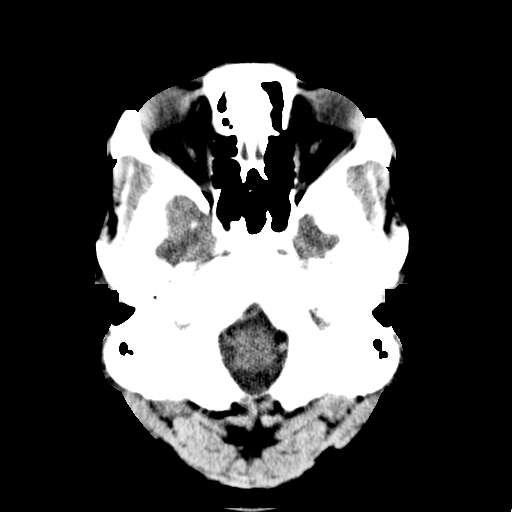
[im 3/30  bone]
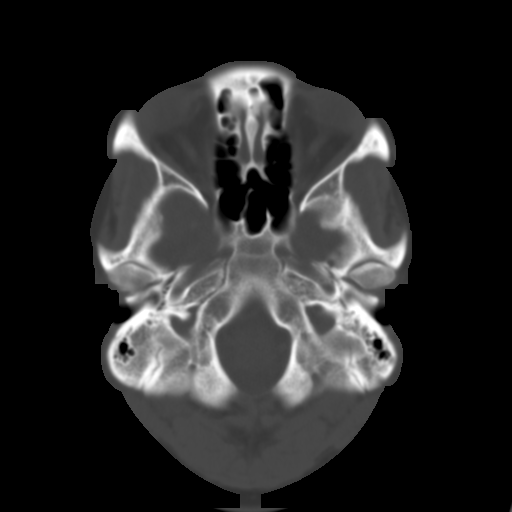
[im 5/30  brain]
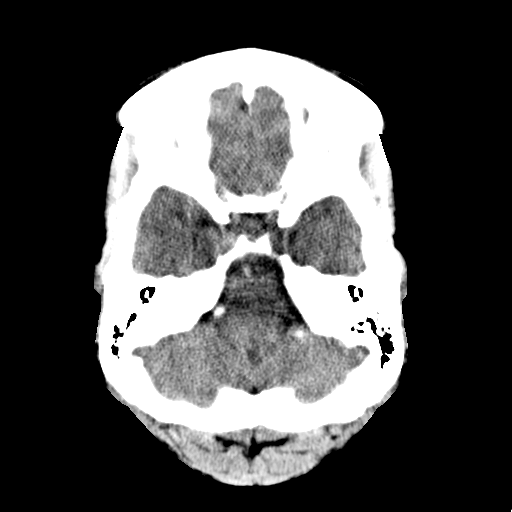
[im 7/30  brain]
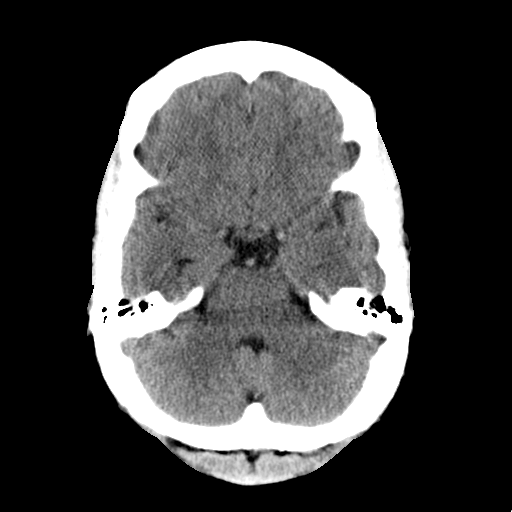
[im 9/30  brain]
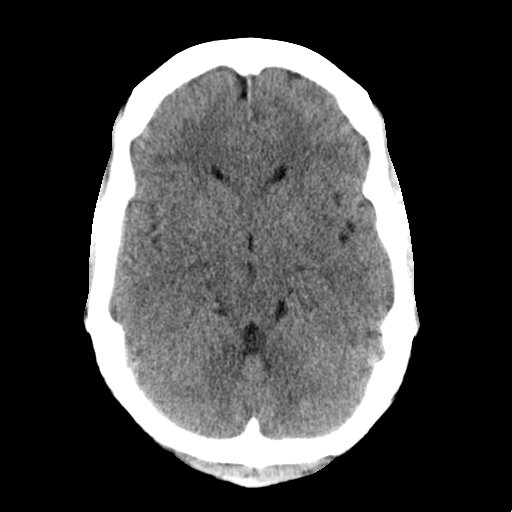
[im 11/30  brain]
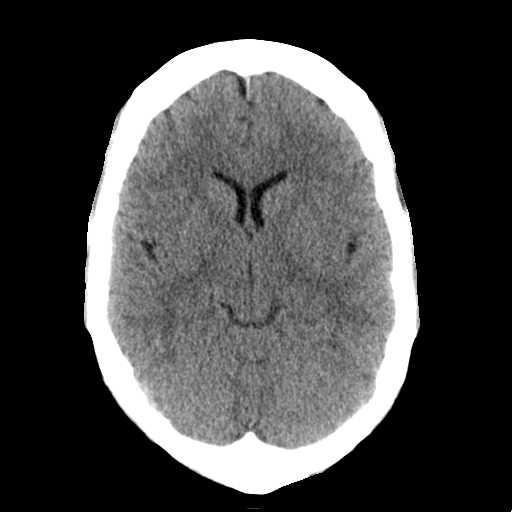
[im 11/30  bone]
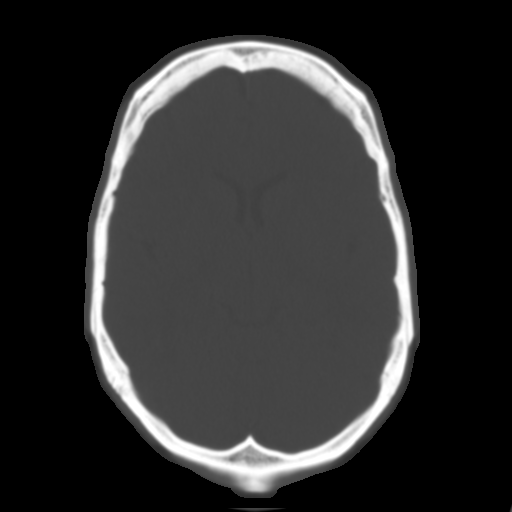
[im 13/30  brain]
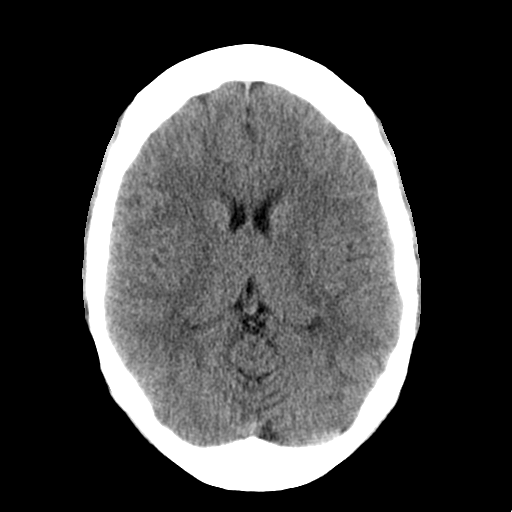
[im 15/30  brain]
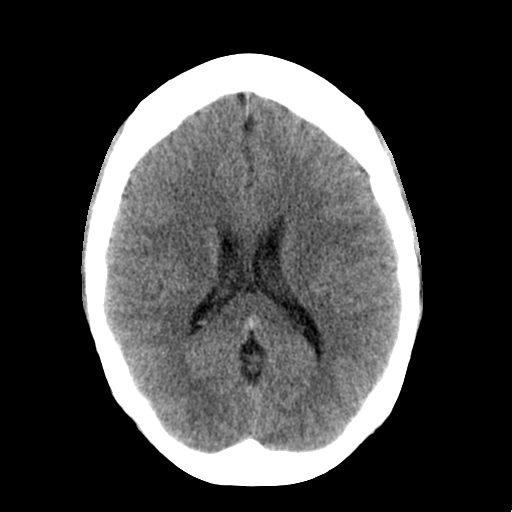
[im 17/30  brain]
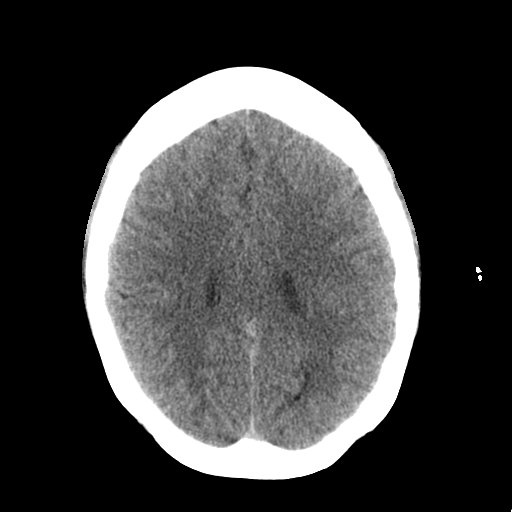
[im 19/30  brain]
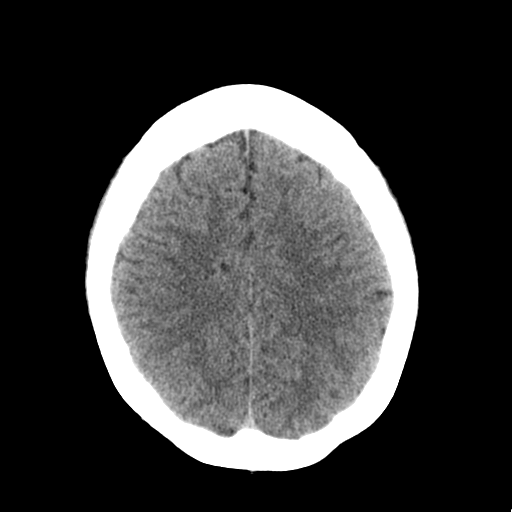
[im 19/30  bone]
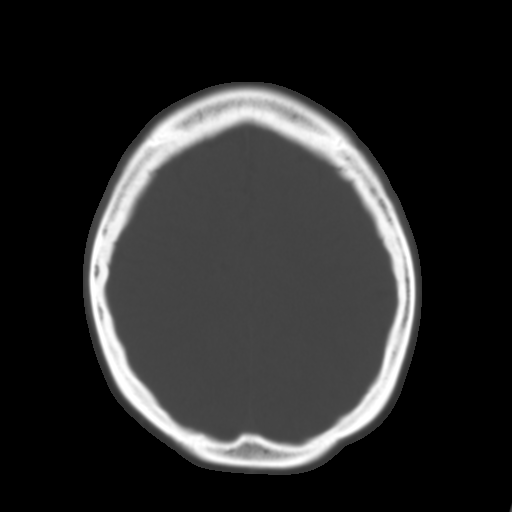
[im 21/30  brain]
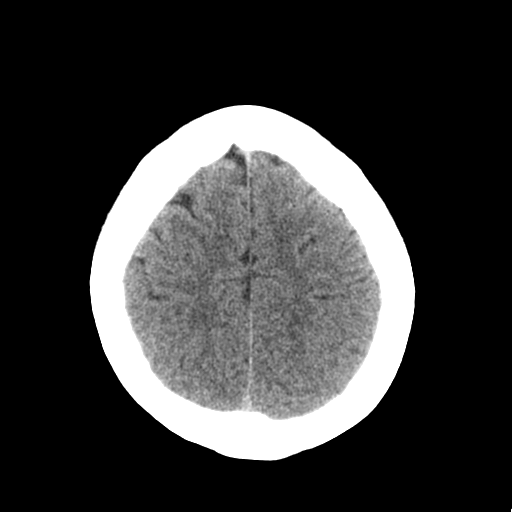
[im 23/30  brain]
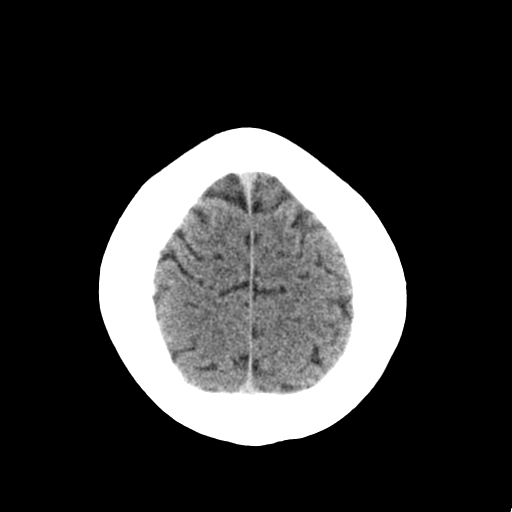
[im 25/30  brain]
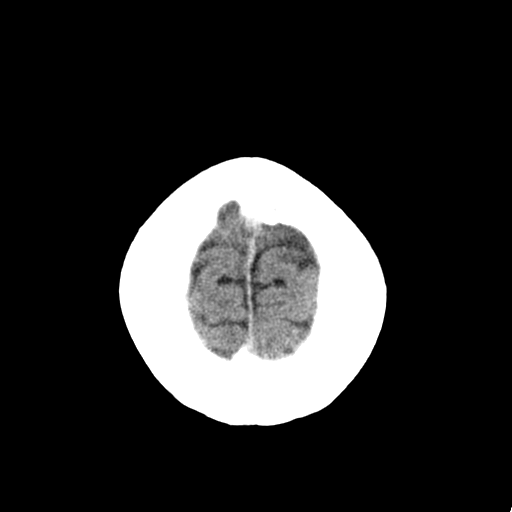
[im 27/30  brain]
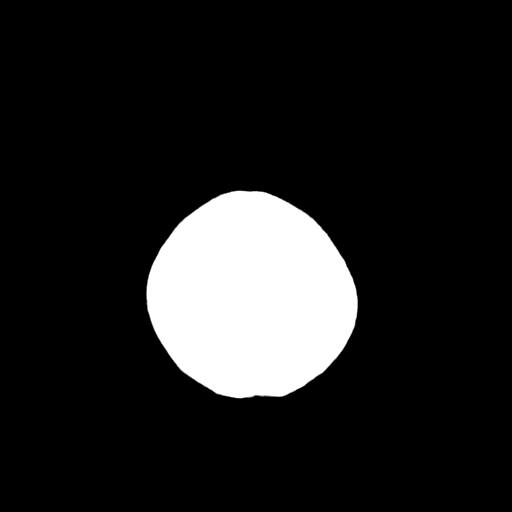
[im 27/30  bone]
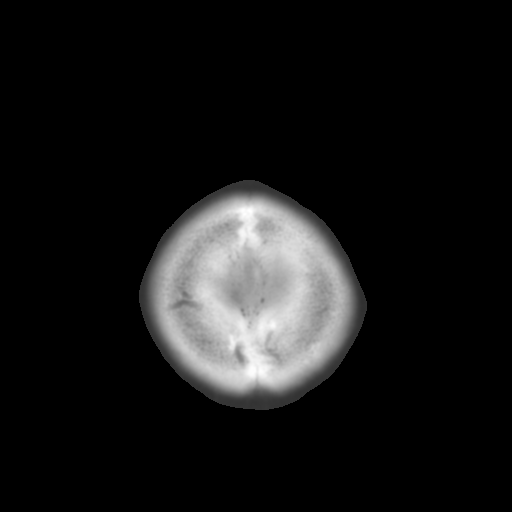

[Series 3: bone · axial · 0.41mm/px · z∈[-148,-108]mm · 3 of 30 slices shown]
[im 3/30  bone]
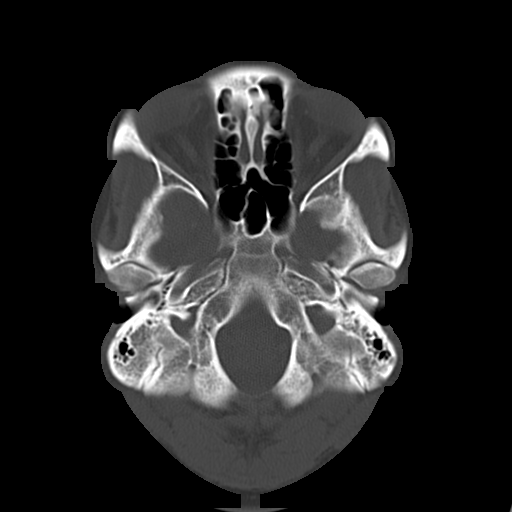
[im 7/30  bone]
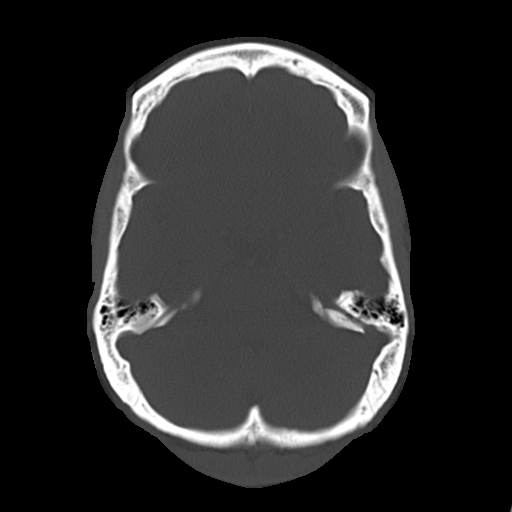
[im 11/30  bone]
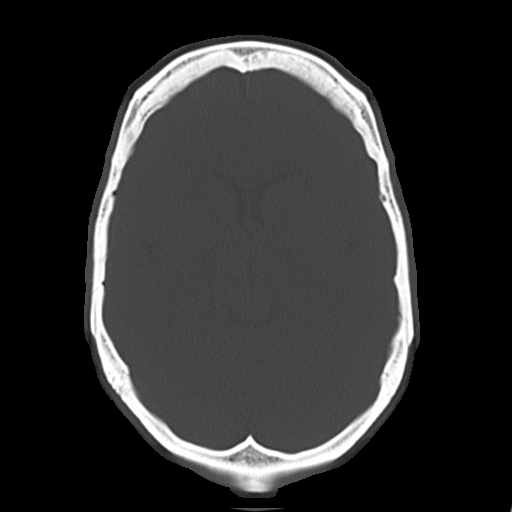

[16 of 30 positions shown; findings below may reference images not displayed]

FINDINGS: Standard nonenhanced CT obtained. No mass. No hydrocephalus. No
hemorrhage. No acute bony abnormality.
IMPRESSION: No acute abnormality.

## 2015-07-26 ENCOUNTER — Telehealth: Payer: Self-pay | Admitting: *Deleted

## 2015-07-26 NOTE — Telephone Encounter (Signed)
Pt called requesting varicella results from last year when she was pregnant, checked all labs from prenatal panel, no varicella present.  Informed pt that the varicella was not completed during her prenatal period.

## 2015-12-18 ENCOUNTER — Encounter: Payer: Self-pay | Admitting: Obstetrics & Gynecology

## 2015-12-18 ENCOUNTER — Ambulatory Visit (INDEPENDENT_AMBULATORY_CARE_PROVIDER_SITE_OTHER): Payer: BLUE CROSS/BLUE SHIELD | Admitting: Obstetrics & Gynecology

## 2015-12-18 VITALS — BP 119/76 | HR 76 | Resp 16 | Ht 66.0 in | Wt 176.0 lb

## 2015-12-18 DIAGNOSIS — N764 Abscess of vulva: Secondary | ICD-10-CM | POA: Diagnosis not present

## 2015-12-18 NOTE — Progress Notes (Signed)
   CLINIC ENCOUNTER NOTE  History:  30 y.o. [id number] here today for evaluation of vulvar boils she noticed two weeks ago.  They have been resolving on their own.  She does not shave, this phenomenon happens occasionally.  She denies any abnormal vaginal discharge, bleeding, pelvic pain or other concerns.   Past Medical History  Diagnosis Date  . Medical history non-contributory     Past Surgical History  Procedure Laterality Date  . Foot surgery Right   . Knee arthroscopy Left     The following portions of the patient's history were reviewed and updated as appropriate: allergies, current medications, past family history, past medical history, past social history, past surgical history and problem list.   Health Maintenance:  Normal pap in 2015.  Review of Systems:  Pertinent items noted in HPI and remainder of comprehensive ROS otherwise negative.  Objective:  Physical Exam BP 119/76 mmHg  Pulse 76  Resp 16  Ht  (1.676 m)  Wt 176 lb (79.833 kg)  BMI 28.42 kg/m2  LMP 11/19/2015  Breastfeeding? Yes CONSTITUTIONAL: Well-developed, well-nourished female in no acute distress.  HENT:  Normocephalic, atraumatic. External right and left ear normal. Oropharynx is clear and moist EYES: Conjunctivae and EOM are normal. Pupils are equal, round, and reactive to light. No scleral icterus.  NECK: Normal range of motion, supple, no masses SKIN: Skin is warm and dry. No rash noted. Not diaphoretic. No erythema. No pallor. NEUROLOGIC: Alert and oriented to person, place, and time. Normal reflexes, muscle tone coordination. No cranial nerve deficit noted. PSYCHIATRIC: Normal mood and affect. Normal behavior. Normal judgment and thought content. CARDIOVASCULAR: Normal heart rate noted RESPIRATORY: Effort and breath sounds normal, no problems with respiration noted ABDOMEN: Soft, no distention noted.   MUSCULOSKELETAL: Normal range of motion. No edema noted. PELVIC: Normal appearing  external genitalia; two 3 mm healing boils on left labium majus.  No erythema, mild induration of the lateral boil.  No tenderness, no fluctuance.  Physical Exam  Genitourinary:      Approximate locations of healing boils.    Assessment & Plan:  Boil of vulva Healing well, no antibiotic therapy or I&D needed.  Told to avoid shaving.  Proper vulvar hygiene emphasized: discussed avoidance of perfumed soaps, detergents, lotions and any type of douches; in addition to wearing cotton underwear and no underwear at night. Told to avoid sweat accumulating in that area, wash after workouts.  Also recommended cleaning front to back, voiding and cleaning up after intercourse.  Told to use OTC remedies to prevent bumps to see if this helps.    Routine preventative health maintenance measures emphasized. Please refer to After Visit Summary for other counseling recommendations.   Return if symptoms worsen or fail to improve.   Total face-to-face time with patient: 15 minutes. Over 50% of encounter was spent on counseling and coordination of care.   Jaynie Collins, MD, FACOG Attending Obstetrician & Gynecologist, [facility]

## 2015-12-18 NOTE — Patient Instructions (Signed)
Return to clinic for any scheduled appointments or for any gynecologic concerns as needed.   

## 2016-03-17 IMAGING — US US OB COMP +14 WK
1 series · 12 of 28 positions shown · non-contrast
Comparison: none

[Series 1: us ob comp +14 wk · 12 of 74 slices shown]
[im 3/74]
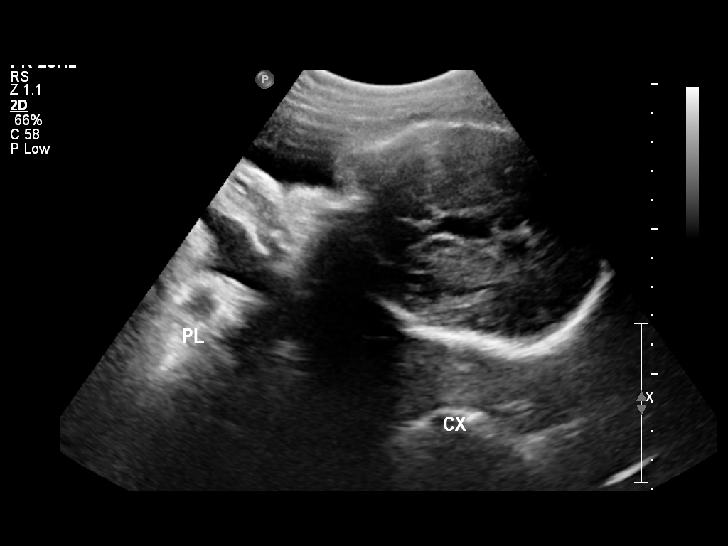
[im 9/74]
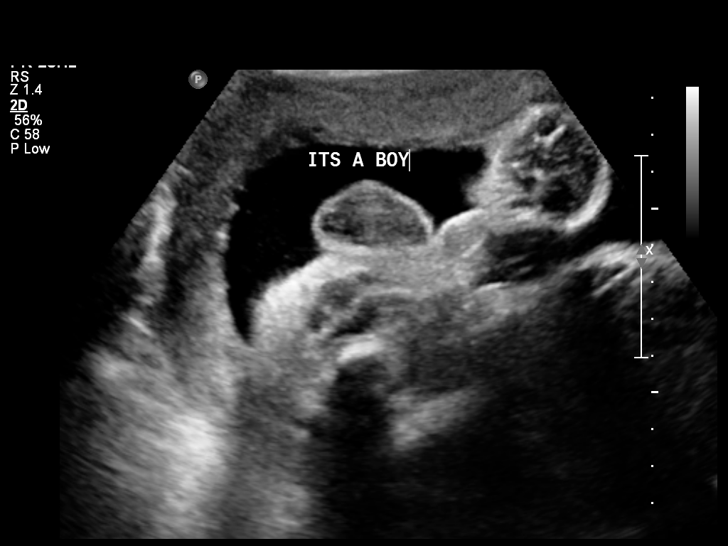
[im 14/74]
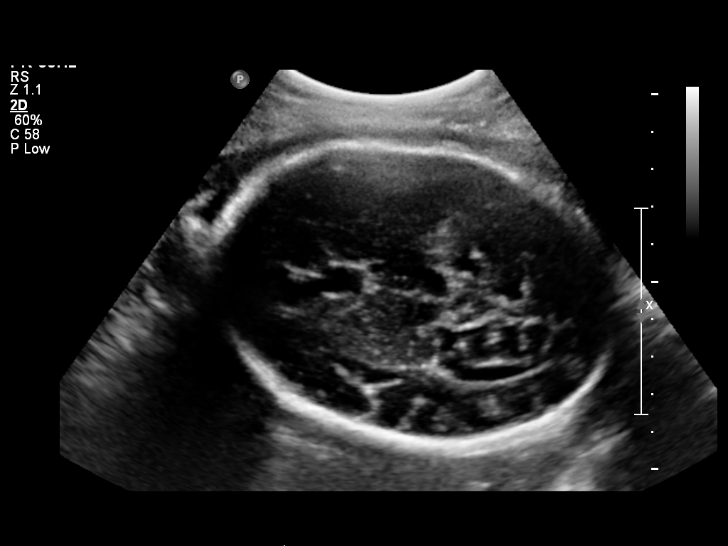
[im 22/74]
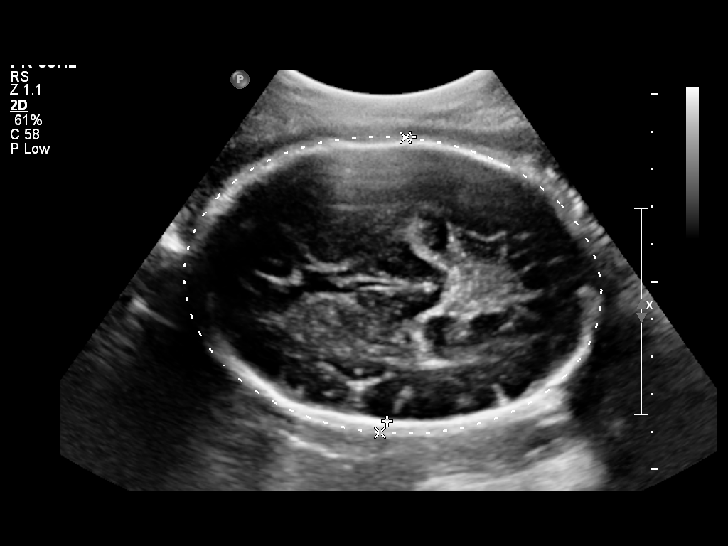
[im 28/74]
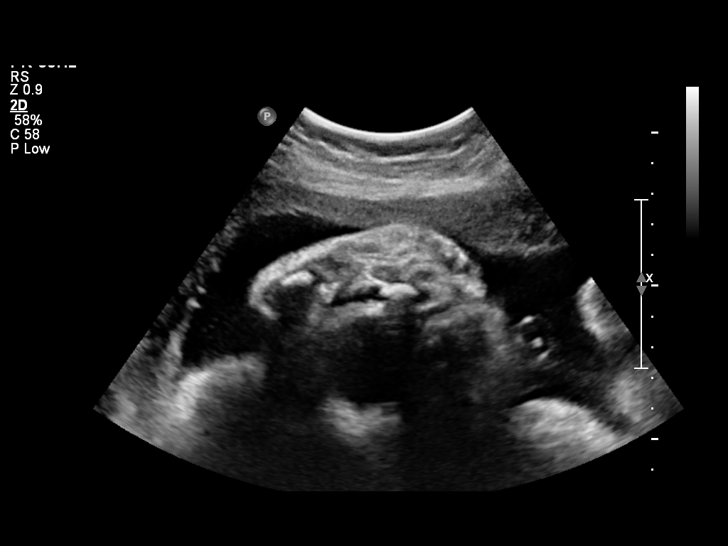
[im 33/74]
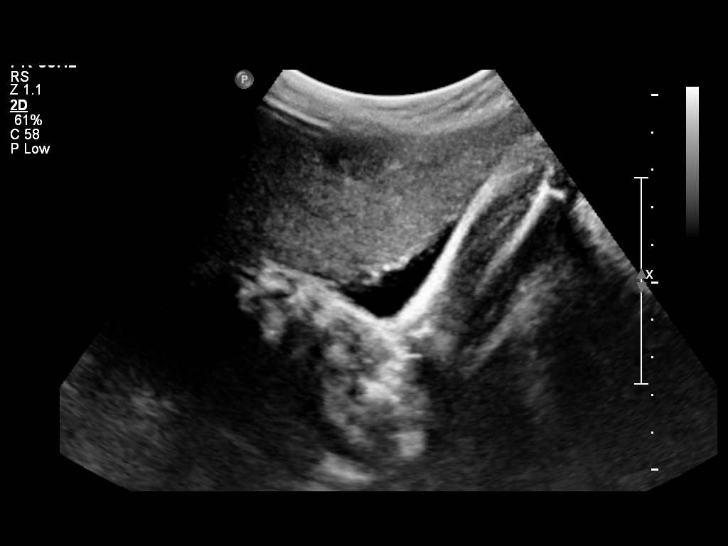
[im 41/74]
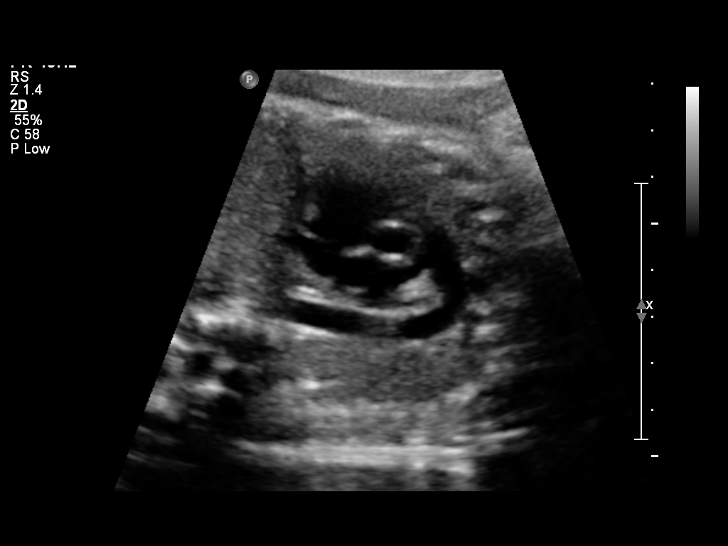
[im 46/74]
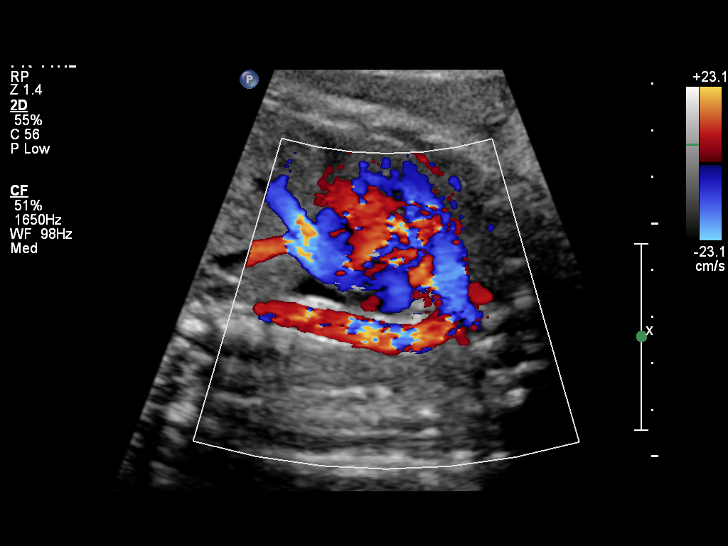
[im 52/74]
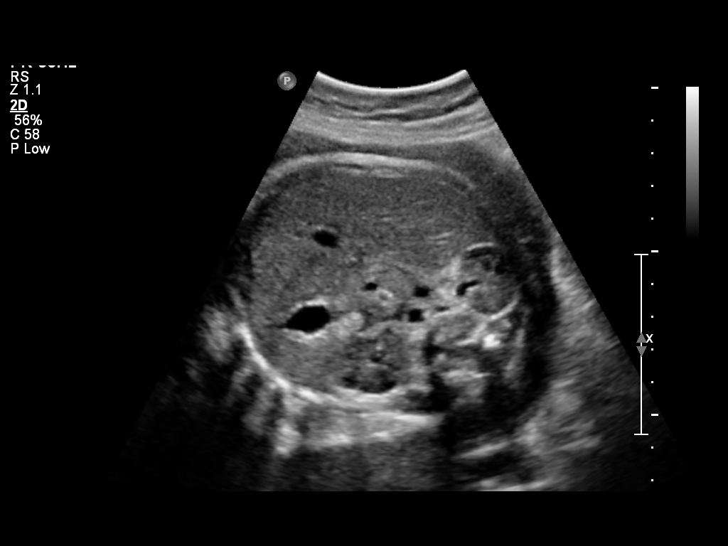
[im 60/74]
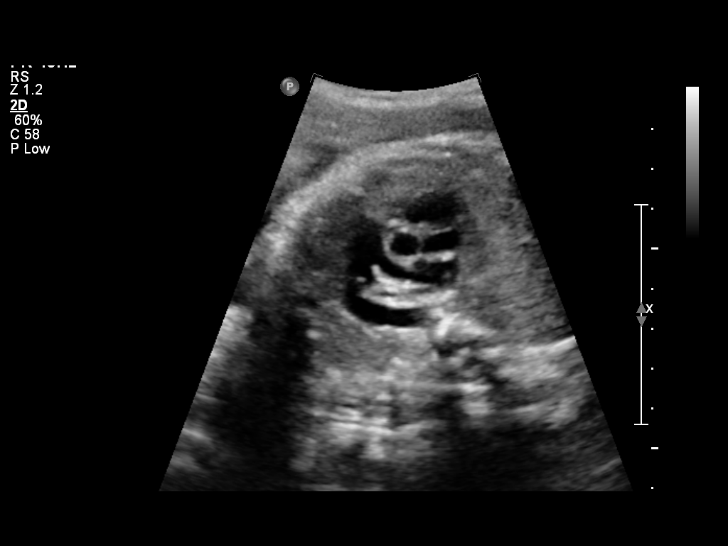
[im 65/74]
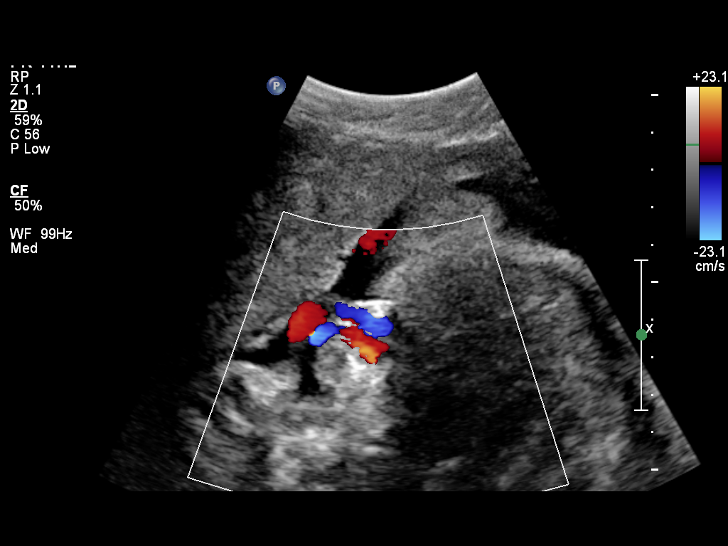
[im 71/74]
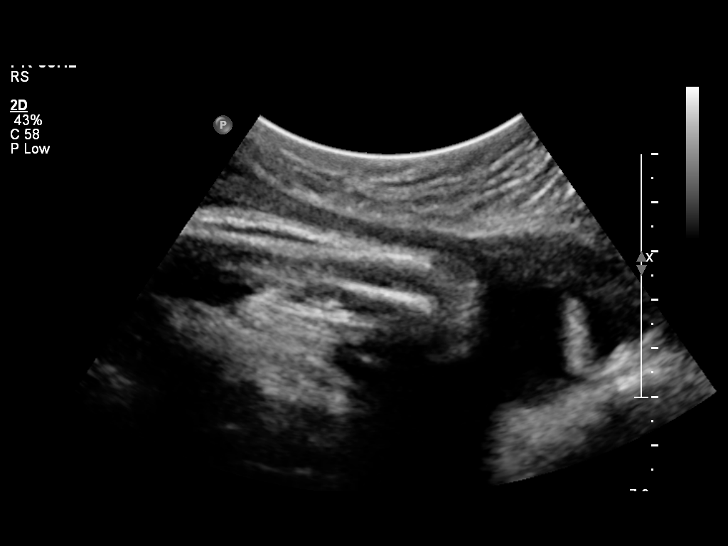

[12 of 28 positions shown; findings below may reference images not displayed]

OBSTETRICS REPORT
                      (Signed Final 04/28/2014 [DATE])

Service(s) Provided

 US OB COMP + 14 WK                                    76805.1
Indications

 Placenta/Low lying: No bleeding (history of)
 Basic anatomic survey
Fetal Evaluation

 Num Of Fetuses:    1
 Fetal Heart Rate:  146                          bpm
 Cardiac Activity:  Observed
 Presentation:      Cephalic
 Placenta:          Posterior, above cervical
                    os
 P. Cord            Visualized, central
 Insertion:

 Amniotic Fluid
 AFI FV:      Subjectively within normal limits
 AFI Sum:     12.5    cm       38  %Tile     Larg Pckt:    4.57  cm
 RUQ:   4.15    cm   RLQ:    3.76   cm    LUQ:   0.02    cm   LLQ:    4.57   cm
Biometry

 BPD:     76.1  mm     G. Age:  30w 4d                CI:        64.85   70 - 86
                                                      FL/HC:      20.3   19.4 -

 HC:     303.7  mm     G. Age:  33w 5d       10  %    HC/AC:      1.04   0.96 -

 AC:     293.2  mm     G. Age:  33w 2d       30  %    FL/BPD:     81.2   71 - 87
 FL:      61.8  mm     G. Age:  32w 0d        5  %    FL/AC:      21.1   20 - 24
 HUM:     56.7  mm     G. Age:  33w 0d       34  %

 Est. FW:    9246  gm      4 lb 8 oz     33  %
Gestational Age

 LMP:           34w 1d        Date:  09/01/13                 EDD:   06/08/14
 U/S Today:     32w 3d                                        EDD:   06/20/14
 Best:          34w 1d     Det. By:  LMP  (09/01/13)          EDD:   06/08/14
Anatomy
 Cranium:          Appears normal         Aortic Arch:      Appears normal
 Fetal Cavum:      Appears normal         Ductal Arch:      Appears normal
 Ventricles:       Appears normal         Diaphragm:        Appears normal
 Choroid Plexus:   Appears normal         Stomach:          Appears normal, left
                                                            sided
 Cerebellum:       Appears normal         Abdomen:          Appears normal
 Posterior Fossa:  Appears normal         Abdominal Wall:   Appears nml (cord
                                                            insert, abd wall)
 Nuchal Fold:      Not applicable (>20    Cord Vessels:     Appears normal (3
                   wks GA)                                  vessel cord)
 Face:             Appears normal         Kidneys:          Appear normal
                   (orbits and profile)
 Lips:             Appears normal         Bladder:          Appears normal
 Heart:            Appears normal         Spine:            Not well visualized
                   (4CH, axis, and
                   situs)
 RVOT:             Appears normal         Lower             Appears normal
                                          Extremities:
 LVOT:             Appears normal         Upper             Appears normal
                                          Extremities:

 Other:  Male gender. Heels visualized. Nasal bone visualized. Technically
         difficult due to advanced gestational age.
Targeted Anatomy

 Fetal Central Nervous System
 Lat. Ventricles:
Cervix Uterus Adnexa

 Cervical Length:    3.06     cm

 Cervix:       Normal appearance by transabdominal scan.
 Left Ovary:    Within normal limits.
 Right Ovary:   Within normal limits.
Comments

 The patient's fetal anatomic survey is not complete.  Fetal
 spine anatomy has not been adequately visualized.
 However, no gross fetal anomalies were identified.
Impression

 Single living intrauterine pregnancy at 34 weeks 1 day.
 Appropriate fetal growth (33%).
 Normal amniotic fluid volume.
 No gross fetal anomalies identified.
Recommendations

 Recommend follow-up ultrasound examination in 4 weeks to
 reassess fetal growth and anatomy.

                [place], [place]

## 2016-10-04 ENCOUNTER — Emergency Department
Admission: EM | Admit: 2016-10-04 | Discharge: 2016-10-04 | Disposition: A | Discharge status: Home / Self Care | Payer: 59 | Attending: Emergency Medicine | Admitting: Emergency Medicine

## 2016-10-04 DIAGNOSIS — Z87891 Personal history of nicotine dependence: Secondary | ICD-10-CM | POA: Insufficient documentation

## 2016-10-04 DIAGNOSIS — M25552 Pain in left hip: Secondary | ICD-10-CM

## 2016-10-04 MED ORDER — OXYCODONE-ACETAMINOPHEN 5-325 MG PO TABS: 1.0000 | ORAL_TABLET | ORAL | 0 refills | Status: DC | PRN

## 2016-10-04 MED ORDER — KETOROLAC TROMETHAMINE 30 MG/ML IJ SOLN
60.0000 mg | Freq: Once | INTRAMUSCULAR | Status: AC
  Administered 2016-10-04: 60 mg via INTRAMUSCULAR
  Filled 2016-10-04: qty 2

## 2016-10-04 MED ORDER — NAPROXEN 500 MG PO TABS: 500.0000 mg | ORAL_TABLET | Freq: Two times a day (BID) | ORAL | 0 refills | Status: AC

## 2016-10-04 NOTE — ED Triage Notes (Signed)
Pt ambulatory to triage with slow but steady gait. Pt reports she has been having pain to both of her hips for about 1 week. Pt reports she was [facility] Thursday and put on a prednisone taper for this. Pt denies injury.

## 2016-10-04 NOTE — ED Provider Notes (Signed)
[facility] Emergency Department Provider Note   ____________________________________________   First MD Initiated Contact with Patient 10/04/16 0425     (approximate)  I have reviewed the triage vital signs and the nursing notes.   HISTORY  Chief Complaint Hip Pain    HPI Suzanne Pace is a 30 y.o. female who presents to the ED from home with a chief complaint of nontraumatic left hip pain.Patient reports a one-week history of pain to her left back, buttocks and hip. States she feels like there is a "hook" around her left knee. Saw her PCP 2 days ago and was placed on a steroid taper. Complains of persistent pain. Denies associated fever, chills, chest pain, shortness of breath, abdominal pain, nausea, vomiting, diarrhea. Denies extremity weakness, numbness/tingling, bowel or bladder incontinence. The makes her symptoms better. Movement and ambulation makes her symptoms worse.   Past Medical History:  Diagnosis Date  . Medical history non-contributory     There are no active problems to display for this patient.   Past Surgical History:  Procedure Laterality Date  . FOOT SURGERY Right   . KNEE ARTHROSCOPY Left     Prior to Admission medications   Medication Sig Start Date End Date Taking? Authorizing Provider  Multiple Vitamin (MULTIVITAMIN) tablet Take 1 tablet by mouth daily.    Historical Provider, MD  naproxen (NAPROSYN) 500 MG tablet Take 1 tablet (500 mg total) by mouth 2 (two) times daily with a meal. 10/04/16   Irean HongJade J Kanchan Gal, MD  oxyCODONE-acetaminophen (ROXICET) 5-325 MG tablet Take 1 tablet by mouth every 4 (four) hours as needed for severe pain. 10/04/16   Irean HongJade J Jozelyn Kuwahara, MD    Allergies Phenergan [promethazine hcl]  Family History  Problem Relation Age of Onset  . Cancer Maternal Aunt     breast,ovarian  . Cancer Maternal Grandmother     ovarian    Social History Social History  Substance Use Topics  . Smoking status:  Former Smoker    Quit date: 09/24/2013  . Smokeless tobacco: Never Used  . Alcohol use Yes     Comment: social-stopped with positive UPT    Review of Systems  Constitutional: No fever/chills. Eyes: No visual changes. ENT: No sore throat. Cardiovascular: Denies chest pain. Respiratory: Denies shortness of breath. Gastrointestinal: No abdominal pain.  No nausea, no vomiting.  No diarrhea.  No constipation. Genitourinary: Negative for dysuria. Musculoskeletal: Positive for left hip pain. Skin: Negative for rash. Neurological: Negative for headaches, focal weakness or numbness.  10-point ROS otherwise negative.  ____________________________________________   PHYSICAL EXAM:  VITAL SIGNS: ED Triage Vitals  Enc Vitals Group     BP 10/04/16 0319 (!) 158/95     Pulse Rate 10/04/16 0319 99     Resp 10/04/16 0319 18     Temp 10/04/16 0319 98.2 F (36.8 C)     Temp Source 10/04/16 0319 Oral     SpO2 10/04/16 0319 98 %     Weight 10/04/16 0319 190 lb (86.2 kg)     Height 10/04/16 0319 5\' 7"  (1.702 m)     Head Circumference --      Peak Flow --      Pain Score 10/04/16 0320 8     Pain Loc --      Pain Edu? --      Excl. in GC? --     Constitutional: Alert and oriented. Well appearing and in no acute distress. Eyes: Conjunctivae are normal. PERRL.  EOMI. Head: Atraumatic. Nose: No congestion/rhinnorhea. Mouth/Throat: Mucous membranes are moist.  Oropharynx non-erythematous. Neck: No stridor.  No cervical spine tenderness to palpation. Cardiovascular: Normal rate, regular rhythm. Grossly normal heart sounds.  Good peripheral circulation. Respiratory: Normal respiratory effort.  No retractions. Lungs CTAB. Gastrointestinal: Soft and nontender. No distention. No abdominal bruits. No CVA tenderness. Musculoskeletal: No spinal tenderness to palpation. Left greater trochanter and anterior thigh are tender to palpation. There is no warmth or effusion to the hip to suggest septic  joint. Limited range of motion secondary to pain.  Neurologic:  Normal speech and language. No gross focal neurologic deficits are appreciated. Slow but steady gait. Skin:  Skin is warm, dry and intact. No rash noted. Psychiatric: Mood and affect are normal. Speech and behavior are normal.  ____________________________________________   LABS (all labs ordered are listed, but only abnormal results are displayed)  Labs Reviewed - No data to display ____________________________________________  EKG  None ____________________________________________  RADIOLOGY  None  ____________________________________________   PROCEDURES  Procedure(s) performed: None  Procedures  Critical Care performed: No  ____________________________________________   INITIAL IMPRESSION / ASSESSMENT AND PLAN / ED COURSE  Pertinent labs & imaging results that were available during my care of the patient were reviewed by me and considered in my medical decision making (see chart for details).  30 year old female who presents with persistent and nontraumatic left hip pain. Patient has had sciatica previously and states this is not feel like prior episodes of sciatica. I feel most likely patient has bursitis. Will add NSAIDs, analgesia in addition to her steroid taper and she will follow-up with orthopedics next week. Strict return precautions given. Patient verbalizes understanding and agrees with plan of care.  Clinical Course      ____________________________________________   FINAL CLINICAL IMPRESSION(S) / ED DIAGNOSES  Final diagnoses:  Left hip pain      NEW MEDICATIONS STARTED DURING THIS VISIT:  New Prescriptions   NAPROXEN (NAPROSYN) 500 MG TABLET    Take 1 tablet (500 mg total) by mouth 2 (two) times daily with a meal.   OXYCODONE-ACETAMINOPHEN (ROXICET) 5-325 MG TABLET    Take 1 tablet by mouth every 4 (four) hours as needed for severe pain.     Note:  This document was  prepared using Dragon voice recognition software and may include unintentional dictation errors.    Irean HongJade J Romesha Scherer, MD 10/04/16 [phone]

## 2016-10-04 NOTE — Discharge Instructions (Signed)
1. Continue and finish steroid dose pack as directed by your doctor. 2. You may take pain medicines as needed (Naprosyn/Percocet). 3. Apply moist heat to affected area several times daily. 4. Return to the ER for worsening symptoms, persistent vomiting, difficulty breathing or other concerns.

## 2019-09-09 ENCOUNTER — Other Ambulatory Visit: Payer: Self-pay

## 2019-09-09 DIAGNOSIS — Z20822 Contact with and (suspected) exposure to covid-19: Secondary | ICD-10-CM

## 2019-09-12 LAB — NOVEL CORONAVIRUS, NAA: SARS-CoV-2, NAA: NOT DETECTED

## 2020-01-04 ENCOUNTER — Other Ambulatory Visit: Payer: Self-pay | Admitting: Student

## 2020-01-04 DIAGNOSIS — N6320 Unspecified lump in the left breast, unspecified quadrant: Secondary | ICD-10-CM

## 2020-01-04 DIAGNOSIS — Z1231 Encounter for screening mammogram for malignant neoplasm of breast: Secondary | ICD-10-CM

## 2020-01-04 DIAGNOSIS — N644 Mastodynia: Secondary | ICD-10-CM

## 2021-09-08 ENCOUNTER — Other Ambulatory Visit: Payer: Self-pay

## 2021-09-08 ENCOUNTER — Emergency Department
Admission: EM | Admit: 2021-09-08 | Discharge: 2021-09-08 | Disposition: A | Discharge status: Home / Self Care | Payer: Federal, State, Local not specified - PPO | Attending: Student in an Organized Health Care Education/Training Program | Admitting: Student in an Organized Health Care Education/Training Program

## 2021-09-08 ENCOUNTER — Emergency Department: Payer: Federal, State, Local not specified - PPO

## 2021-09-08 DIAGNOSIS — H5712 Ocular pain, left eye: Secondary | ICD-10-CM

## 2021-09-08 DIAGNOSIS — Z87891 Personal history of nicotine dependence: Secondary | ICD-10-CM | POA: Diagnosis not present

## 2021-09-08 DIAGNOSIS — H538 Other visual disturbances: Secondary | ICD-10-CM | POA: Diagnosis not present

## 2021-09-08 LAB — CBC
HCT: 41.6 % (ref 36.0–46.0)
Hemoglobin: 14.7 g/dL (ref 12.0–15.0)
MCH: 32.2 pg (ref 26.0–34.0)
MCHC: 35.3 g/dL (ref 30.0–36.0)
MCV: 91 fL (ref 80.0–100.0)
Platelets: 246 10*3/uL (ref 150–400)
RBC: 4.57 MIL/uL (ref 3.87–5.11)
RDW: 13.2 % (ref 11.5–15.5)
WBC: 10.2 10*3/uL (ref 4.0–10.5)
nRBC: 0 % (ref 0.0–0.2)

## 2021-09-08 LAB — BASIC METABOLIC PANEL
Anion gap: 7 (ref 5–15)
BUN: 10 mg/dL (ref 6–20)
CO2: 23 mmol/L (ref 22–32)
Calcium: 8.9 mg/dL (ref 8.9–10.3)
Chloride: 107 mmol/L (ref 98–111)
Creatinine, Ser: 0.88 mg/dL (ref 0.44–1.00)
GFR, Estimated: >60 mL/min (ref >60)
Glucose, Bld: 86 mg/dL (ref 70–99)
Potassium: 3.8 mmol/L (ref 3.5–5.1)
Sodium: 137 mmol/L (ref 135–145)

## 2021-09-08 LAB — HCG, QUANTITATIVE, PREGNANCY: hCG, Beta Chain, Quant, S: <1 m[IU]/mL (ref <5)

## 2021-09-08 LAB — TSH: TSH: 0.685 u[IU]/mL (ref 0.350–4.500)

## 2021-09-08 LAB — T4, FREE: Free T4: 0.82 ng/dL (ref 0.61–1.12)

## 2021-09-08 MED ORDER — AMOXICILLIN-POT CLAVULANATE 875-125 MG PO TABS: 1.0000 | ORAL_TABLET | Freq: Once | ORAL | Status: DC

## 2021-09-08 MED ORDER — IOHEXOL 300 MG/ML  SOLN
75.0000 mL | Freq: Once | INTRAMUSCULAR | Status: AC | PRN
  Administered 2021-09-08: 100 mL via INTRAVENOUS
  Filled 2021-09-08: qty 75

## 2021-09-08 MED ORDER — IPRATROPIUM-ALBUTEROL 0.5-2.5 (3) MG/3ML IN SOLN: 3.0000 mL | Freq: Once | RESPIRATORY_TRACT | Status: DC

## 2021-09-08 MED ORDER — OXYCODONE-ACETAMINOPHEN 5-325 MG PO TABS: 1.0000 | ORAL_TABLET | Freq: Four times a day (QID) | ORAL | 0 refills | Status: AC | PRN

## 2021-09-08 MED ORDER — OXYCODONE-ACETAMINOPHEN 5-325 MG PO TABS
1.0000 | ORAL_TABLET | ORAL | Status: AC
  Administered 2021-09-08: 1 via ORAL
  Filled 2021-09-08: qty 1

## 2021-09-08 NOTE — ED Triage Notes (Signed)
Pt comes with c/o eye problem to left eye. Pt states her eye was protruding out of her socket. Husband states he saw this. Pt states she just couldn't see her eyelid and vision was blurry.  Pt states she was referred her for optomologist

## 2021-09-08 NOTE — ED Notes (Signed)
IV placed and green lav collected per Darl Pikes request. No orders at this time.
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
## 2021-09-08 NOTE — ED Provider Notes (Signed)
[facility] Emergency Department Provider Note   ____________________________________________   Event Date/Time   First MD Initiated Contact with Patient 09/08/21 1308     (approximate)  I have reviewed the triage vital signs and the nursing notes.   HISTORY  Chief Complaint Eye Problem    HPI Suzanne Pace is a 35 y.o. female reports no major past medical history other than being on birth control tablet  Was at home this morning she was rubbing her eyes when all of a sudden she felt like her left eye popped out of its socket.  She reports that her husband thought the eye was protruding from the socket and she had to push it back in.  She reports that when she was rubbing the eyes the mid initially her vision was black or dim, and that went away after a couple of seconds.  Now she is experiencing pain over the left eye worse with movement of the eye and with bright light.  She can see out of the eye but vision seems to be distorted as though letters are fonts are bent in squiggly patterns.  Denies loss of vision or blindness in the area of the eye at this point.  No preceding illness.  Was not coughing.  No history of any thyroid disease has never had any issues like this before.  I was able to be pushed back in but still feels sore and painful  Past Medical History:  Diagnosis Date   Medical history non-contributory     There are no problems to display for this patient.   Past Surgical History:  Procedure Laterality Date   FOOT SURGERY Right    KNEE ARTHROSCOPY Left     Prior to Admission medications   Medication Sig Start Date End Date Taking? Authorizing Provider  oxyCODONE-acetaminophen (PERCOCET/ROXICET) 5-325 MG tablet Take 1 tablet by mouth every 6 (six) hours as needed for severe pain. 09/08/21  Yes Sharyn Creamer, MD  Multiple Vitamin (MULTIVITAMIN) tablet Take 1 tablet by mouth daily.    [provider]  naproxen (NAPROSYN)  500 MG tablet Take 1 tablet (500 mg total) by mouth 2 (two) times daily with a meal. 10/04/16   Irean Hong, MD    Allergies Phenergan [promethazine hcl]  Family History  Problem Relation Age of Onset   Cancer Maternal Aunt        breast,ovarian   Cancer Maternal Grandmother        ovarian    Social History Social History   Tobacco Use   Smoking status: Former    Types: Cigarettes    Quit date: 09/24/2013    Years since quitting: 7.9   Smokeless tobacco: Never  Substance Use Topics   Alcohol use: Yes    Comment: social-stopped with positive UPT   Drug use: No    Review of Systems Constitutional: No fever/chills or recent illness Eyes: See HPI.  Denies any issue with the right eye.  Not a suffered any injury or trauma ENT: No neck pain Respiratory: Denies shortness of breath. Gastrointestinal: No abdominal pain.   Genitourinary: Denies pregnancy  musculoskeletal: Negative for back pain. Skin: Negative for rash but does seem a bit swollen around her left eyelid now. Neurological: Negative for headaches, areas of focal weakness or numbness.    ____________________________________________   PHYSICAL EXAM:  VITAL SIGNS: ED Triage Vitals  Enc Vitals Group     BP 09/08/21 1203 (!) 153/103  Pulse Rate 09/08/21 1203 85     Resp 09/08/21 1203 18     Temp 09/08/21 1203 98 F (36.7 C)     Temp src --      SpO2 09/08/21 1203 100 %     Weight --      Height --      Head Circumference --      Peak Flow --      Pain Score 09/08/21 1152 0     Pain Loc --      Pain Edu? --      Excl. in GC? --     Constitutional: Alert and oriented. Well appearing and in no acute distress. Eyes: Conjunctivae are normal.  Extraocular movements appear to be intact.  She does report pain especially with left lateral guys about the left eye.  There is no obvious visual field loss.  At arms length she is able to clearly read the letters on my badge using her right eye, but with the  left eye she is able to read the letters but reports they feel squiggly or somehow misshapen.  She does report photophobia over the left eye to examination.  Right eye does not demonstrate photophobia.  Pupils are equal round and reactive to light and accommodation.  There is perhaps a hint of slight proptosis involving the left eye compared to the right, but is somewhat difficult to discern as she has some slight anterior eyelid swelling as well.  The conjunctiva and anterior chamber appear normal.  The posterior retina bilaterally are crisp and vessels clearly visible with ophthalmoscope.  No blood and thunder Head: Atraumatic. Nose: No congestion/rhinnorhea. Mouth/Throat: Mucous membranes are moist. Neck: No stridor.  Cardiovascular: Normal rate, regular rhythm. Grossly normal heart sounds.   Respiratory: Normal respiratory effort.  No retractions.  Gastrointestinal: Soft and nontender. No distention. Musculoskeletal: No lower extremity tenderness nor edema. Neurologic:  Normal speech and language. No gross focal neurologic deficits are appreciated.  Skin:  Skin is warm, dry and intact. No rash noted. Psychiatric: Mood and affect are normal. Speech and behavior are normal.  ____________________________________________   LABS (all labs ordered are listed, but only abnormal results are displayed)  Labs Reviewed  TSH  T4, FREE  CBC  BASIC METABOLIC PANEL  HCG, QUANTITATIVE, PREGNANCY  THYROID STIMULATING IMMUNOGLOBULIN   ____________________________________________  EKG   ____________________________________________  RADIOLOGY  CT Orbits W Contrast  Result Date: 09/08/2021 CLINICAL DATA:  Left eye pain EXAM: CT ORBITS WITH CONTRAST TECHNIQUE: Multidetector CT images was performed according to the standard protocol following intravenous contrast administration. CONTRAST:  OMNIPAQUE IOHEXOL 300 MG/ML  SOLN COMPARISON:  CT head 93314 FINDINGS: Orbits: No orbital mass or  evidence of inflammation. Normal appearance of the globes, optic nerve-sheath complexes, extraocular muscles, orbital fat and lacrimal glands. There is no proptosis. Visible paranasal sinuses: There is a mucous retention cyst in the left maxillary sinus. Soft tissues: Unremarkable. Osseous: Unremarkable. Limited intracranial: The imaged portions of the intracranial compartment are unremarkable. IMPRESSION: Normal CT appearance of the globes and orbits no acute finding. Electronically Signed   By: Lesia Hausen M.D.   On: 09/08/2021 14:33    CT head reviewed negative for acute finding ____________________________________________   PROCEDURES  Procedure(s) performed: None  Procedures  Critical Care performed: No  ____________________________________________   INITIAL IMPRESSION / ASSESSMENT AND PLAN / ED COURSE  Pertinent labs & imaging results that were available during my care of the patient were reviewed by  me and considered in my medical decision making (see chart for details).   Patient presents with signs and symptoms concerning for possible Agbata subluxation as described.  She does have perhaps some mild edema about the left eyelid but no obvious proptosis on exam.  Able to go through extraocular movements but does report some pain and also some photophobia.  No injury.  The anterior conjunctiva appear normal.  Reassuring clinical exam although she does report some slight distortion clarity of vision  ----------------------------------------- 1:52 PM on 09/08/2021 ----------------------------------------- Case care clinical presentation and exam discussed with Dr. Druscilla Brownie of ophthalmology.  He advises obtaining a CT orbits with IV contrast as well as TSH and thyroid studies as directed by himself which I have ordered.    Clinical Course as of 09/08/21 1535  Sun Sep 08, 2021  1426 Ocular tonometry intraocular pressure right eye 15 mm, left eye 16 mmHg.  Patient reports pain has  reduced from a 10 to 6 out of 10, but still having pain.  Will give additional pain medication.  Fully awake and alert awaiting CT results [MQ]  1508 [facility] - appointment tomorrow morning with Dr. Druscilla Brownie. Arrive at [employer].  [MQ]    Clinical Course User Index [MQ] Sharyn Creamer, MD   ----------------------------------------- 3:36 PM on 09/08/2021 -----------------------------------------   I will prescribe the patient a narcotic pain medicine due to their condition which I anticipate will cause at least moderate pain short term. I discussed with the patient safe use of narcotic pain medicines, and that they are not to drive, work in dangerous areas, or ever take more than prescribed (no more than 1 pill every 6 hours). We discussed that this is the type of medication that can be  overdosed on and the risks of this type of medicine. Patient is very agreeable to only use as prescribed and to never use more than prescribed.  Discussed again with Dr. Druscilla Brownie, he advises that at this point no emergent intervention.  Recommends follow-up and patient will be able to see him tomorrow at 8 AM.  Discussed with the patient and her husband, both in agreement the very understanding of the plan and careful return precautions with plan for follow-up and detailed assessment at [facility] with Dr. Druscilla Brownie tomorrow at 8 AM  Return precautions and treatment recommendations and follow-up discussed with the patient who is agreeable with the plan.   ____________________________________________   FINAL CLINICAL IMPRESSION(S) / ED DIAGNOSES  Final diagnoses:  Acute left eye pain        Note:  This document was prepared using Dragon voice recognition software and may include unintentional dictation errors       Sharyn Creamer, MD 09/08/21 1537

## 2021-09-08 NOTE — Discharge Instructions (Addendum)
Please as we discussed follow-up tomorrow at 8 AM at [facility]  Return to emergency room right away if you have any loss of vision, severe worsening of pain, double vision, recurrence of your eyeball bulging out or popping forward, or other new concerns arise  No driving today or while taking oxycodone.  Do not take your next dose of oxycodone until at least 6 PM this evening.  Use only as prescribed

## 2021-09-08 NOTE — ED Notes (Signed)
Pt endorsing today "that I was rubbing my eye this morning when I woke up, and my L vision went black and I felt my eyeball pop out. I covered it with my hand and gently pushed and it went back in." Pt endorsing photosensitivity and pain with abduction of eye. Pt endorsing double and "Wonky" vision.

## 2021-09-10 LAB — THYROID STIMULATING IMMUNOGLOBULIN: Thyroid Stimulating Immunoglob: <0.1 IU/L (ref 0.00–0.55)

## 2023-10-21 NOTE — L&D Delivery Note (Signed)
 [facility] Delivery Note: Vaginal   Review the Delivery Report for details. <redacted file path>   Suzanne Pace is a 38 y.o., now [id number] with an estimated date of delivery of 07/05/24, who delivered at [redacted]w[redacted]d gestation. The patient is GBS negative.     Membrane rupture occurred at 11:33 PM on 06/21/2024, and the amniotic fluid was bloody.   With epidural anesthesia, the patient's labor was induced with one dose of 25 micrograms of MISOPROSTOL.    Delivery Procedure   [name] had excessive intrapartum bleeding as follows:  Estimated 750 mL's noted with spontaneous rupture of membranes Estimated 120 mL clot spontaneously expressed when going from sitting to laying down after epidural placement  Maternal-fetal status was closely monitored due to the bleeding.  A second IV site was established and 2 units of packed RBC's were placed on hold in the blood bank.  Suzanne Pace felt a strong urge to push and deep early variable decels were noted shortly before 0200; SVE was C/C/+4.  She pushed in semi-recumbent position with legs supported by stirrups through 2 contractions. Baby presented LOA, then restituted to direct OA.    The NICU team was called due to meconium-stained fluid and arrived just as baby's head was crowning.    Spontaneous vaginal birth of a vigorous, crying female infant followed as baby girl's father announced her gender.  A nuchal cord, likely present before labor, was reduced on the perineum.  Suzanne Pace was then placed skin-to-skin on Suzanne Pace's chest.  Cord clamping was delayed until after cessation of pulsation, at which time the cord was clamped by CNM then cut by Dad.   Cord blood was collected.    Spontaneous intact placenta with 3VC via Duncan mechanism followed.  Close examination of placenta revealed what appeared to be an area of abruption over about 10% of the surface area of placenta.  Family to take placenta home.  Fundus immediately  became firm @ U/U with massage and IV pitocin .    Careful inspection of cervix, vagina, perineum and vulva revealed no lacerations.  EBL 250 ml's for a total EBL from induction of labor to birth of approximately 1200 mL's.    Fundus remained firm @ U/U.  Lap and needle counts correct.  Suzanne Pace and Suzanne Pace were skin-to-skin and stable after birth; Apgars 8 & 9.    Electronically Signed: Heron BIRCH Metzelaars, CNM 06/22/2024 / 2:54 AM

## 2024-06-23 NOTE — Discharge Summary (Signed)
 [facility]   Obstetrics Discharge Note  Discharge Details   Admit date:         06/21/2024 Discharge date:         06/23/2024  Hospital Days:    2 days  Active Hospital Problems   Diagnosis Date Noted POA  . *SVD (spontaneous vaginal delivery) (*) 06/22/2024 Not Applicable  . Intrapartum hemorrhage (*) 06/22/2024 Clinically Undetermined  . COVID-19 on 8/27 06/21/2024 Yes  . Chronic hypertension affecting pregnancy (*) 03/28/2024 Yes  . Antiphospholipid syndrome (*) 08/01/2022 Yes  . Generalized anxiety disorder 09/08/2019 Yes    Resolved Hospital Problems  No resolved problems to display.      Current Discharge Medication List     START taking these medications      Details  docusate sodium  capsule Commonly known as: COLACE,DOK,DOCQLACE  Take one capsule (100 mg dose) by mouth 2 (two) times daily for 10 days. Quantity: 20 capsule   HYDROcodone-acetaminophen  5-325 mg per tablet Commonly known as: NORCO  Take one tablet by mouth every 6 (six) hours as needed for up to 5 days. Max Daily Amount: 4 tablets Quantity: 20 tablet   ibuprofen  600 mg tablet Commonly known as: ADVIL ,MOTRIN   Take one tablet (600 mg dose) by mouth every 8 (eight) hours as needed for Pain for up to 14 days. Quantity: 42 tablet       CONTINUE these medications which have NOT CHANGED      Details  enoxaparin 40 mg/0.4 mL injection Commonly known as: LOVENOX  Inject 0.4 mLs (40 mg dose) into the skin daily. Quantity: 12 mL   escitalopram  oxalate 10 mg tablet Commonly known as: LEXAPRO   Take one tablet (10 mg dose) by mouth daily.   NIFEdipine 30 mg 24 hr tablet Commonly known as: PROCARDIA XL  Take one tablet (30 mg dose) by mouth daily. Quantity: 30 tablet   PNV PO  Take by mouth.      * You might also be taking other medications not listed above. If you have questions about any of your other medications, talk to the person who prescribed them or your  Primary Care Provider.          STOP taking these medications    aspirin 81 mg chewable tablet   famotidine 20 mg tablet Commonly known as: PEPCID   prochlorperazine 5 MG tablet Commonly known as: COMPAZINE        Assessment/Plan   Active Hospital Problems   Diagnosis Date Noted POA  . *SVD (spontaneous vaginal delivery) (*) 06/22/2024 Not Applicable  . Intrapartum hemorrhage (*) 06/22/2024 Clinically Undetermined  . COVID-19 on 8/27 06/21/2024 Yes  . Chronic hypertension affecting pregnancy (*) 03/28/2024 Yes  . Antiphospholipid syndrome (*) 08/01/2022 Yes  . Generalized anxiety disorder 09/08/2019 Yes    Resolved Hospital Problems  No resolved problems to display.    Physicians involved in care during this hospitalization Attending Provider: Miguel CHRISTELLA Molt, MD Attending Provider: Precious Andrea Casco, MD Admitting Provider: Miguel CHRISTELLA Molt, MD Anesthesiologist: Lonni JINNY Bach, MD Bedside Procedures   No orders found       Condition at Discharge: stable  Activity Instructions     Activity order:  No restrictions     Slowly get back to normal activity after two weeks of rest. No driving as long as you are taking narcotics or opioids.   Activity order:  rest often as permitted by your baby's feeding/sleeping schedule     Gradually  increase daily activities until you are back to your normal routine     May Shower        Diet Instructions     Drink 8-10 glasses water per day     Eat a well balanced diet        Other Instructions     Consult your healthcare provider about which birth control methods are best for you and are compatible with breast feeding     If you are interested.   Consult your healthcare provider to see if your body has recovered enough for becoming sexually active again     We would like to review you options for contraception/birth control if that is something you are interested in.   Discharge Follow-Up     Call the  office to set up your routine 6wk postpartum visit.  2 week postpartum follow up available by request- call the office to schedule as needed   Discharge instructions: nothing in vagina for 6 weeks     No sex, tampons or douching   Discharge instructions: wash hands after using the bathroom, before breast feeding and before handling your baby     Do not douche or use tampons for the first six weeks after discharge     If you have stitches, the stitches will dissolve.   Follow the instructions of your healthcare provider for use of laxatives, stool softeners and pain medications     Now that you are not pregnant you can take ibuprofen  800mg  every 6-8 hours as needed for mild to moderate pain.  You can also take colace as a stool softener until you feel your bowel pattern is back to your normal.   Notify physician of blurred vision, spots in your vision, or headache not relieved by Tylenol      This can be a sign that your blood pressure is elevated higher than normal it is important to call if this is happening.   Notify physician of constipation longer than three days     Feel free to take a stool softener (Colace 100mg ) until your bowel pattern returns to normal. If you feel you are having problems with constipation it may be a good idea to not take iron products until you are back to your normal bowel pattern. Keep in mind that narcotics can also make you constipated.  Let us  know if you are not passing gas or feel you are becoming more bloated. High fiber diets and increased hydration also can reduce constipation.   Notify physician of crying spells or depression that feels out of control     If you have a strong personal or family history of depression or anxiety it might be good to follow up in the office at 2 weeks postpartum instead of waiting for 6 weeks. Crying and being emotional may be normal for the two weeks, but should not continue or get progressively worse. If you feel like you are not  getting back to yourself by the two week mark please reach out and come see us !   Notify physician of difficulty breathing, dizziness or fainting     This is common if you lost a lot of blood; but if this was not reviewed before you left the hospital it is important to make us  aware.   Notify physician of fever over 100.4 F     Notify physician of heavy or prolonged bleeding     Call if you soak a pad in an  hour, or if you pass a clot larger than a lime.   Notify physician of pain that becomes worse and not controlled by your pain medication     Notify physician of painful, firm, red area on one breast or with flulike symptoms or cracked bleeding nipples     If you are not breastfeeding- be sure to wear a supportive bra for most of the day and night. This will help to reduce stimulation to the breast. Check out [employer]org for more helpful milk reduction suggestions.   My goal is to give you some tools that help you feel empowered to breastfeed your new baby. It can at times be a struggle but you will get through it. Here are links for some helpful videos!   These will help reinforce latching and watching for swallowing: https://www.breastfeedinginc.ca/vdeoscat/english/  This is a great resource as well for how big your newborn's stomach is in the beginning, and what to expect in those first few days: CrisisRepair.gl  This web site is a Chief Technology Officer for breastfeeding - pretty much any questions you have, you can find the answers, or they can direct to more resources: http://kellymom.com/   The infant risk center is a great place to find information on medications and mother's milk. It will tell you what and how much passes into breastmilk so you can be informed, if you ever have any concerns about medications you are on: http://www.infantrisk.com/    Breastfeeding Support Groups  [facility] Call  for an appointment [facility]: [phone] [facility]: [phone] [facility]: [phone]  [facility] at [facility] is a free resource for pregnant and breastfeeding mothers to get support from certified lactation consultants and to share experience.  Questions: [phone]  Location: [place] [address], [place] [address]  [facility] [employer] Metropolitan[employer].com.ee [phone] [facility] is a supportive community of [place] families of color. Meets on the last Monday of each month (check facebook for details) 6:30-7:30 pm  Breastfeeding with Mellissa (Will come to your house) [facility] Call or txt: [phone] [email or web address] [email or web address]  (Facebook)  Chief Strategy Officer (Will come to your house) HUBERT ALFREDIA COLLET, BSN, RN, [facility]   Notify physician of passing large blood clots     Please call for clots larger than a lime.   Notify physician of sudden onset of severe Abdominal Pain     Notify physician of urination (peeing) that is painful, burns, is difficult or too frequent     Notify physician of vaginal bleeding     Notify the office of heavy vaginal bleeding. Please call for...bright red vaginal bleeding that saturates a pad in one hour or less, or clots the size of a lime or larger. Normal Bleeding is will be heavier at first and decrease gradually and will be bright red (2-3 days)  pinkish or brown (day 3 - day 10)  creamy or yellow (can lasts 12 weeks).   Notify physician vaginal drainage with a bad odor     Refer to Mother and New Baby Care booklet for further instructions and information        Contact information for follow-up     [facility]   [address] [place] [address]  Phone: [phone]     Next Steps: Schedule an appointment as soon as possible for a visit in 6 week(s)    Instructions: routine postpartum appointment  [facility]   [address] [place] [address]  Phone: [phone]     Next Steps: Go in 4 day(s)   Instructions: blood pressure check   Truman Buzzard, LCSW  Specialty: Social Work   [address] [place] [address]  Phone: [phone]     Next Steps: Go in 2 week(s)   Instructions: early mental health check in          Interval History  History of Present Illness [id number] s/p SVD Hx CHTN on nifedipine, to be seen in 4 days for BP check. Lovenox reordered for 6 weeks. Hx depression on lexapro , early follow up with [facility] set up. SUBJECTIVE: Doing well and happy; Recovery going as expected: yes. Satisfied with birth experience and PP care thus far. Ambulating: yes  NB feeding: breast and without difficulty Bowel: Flatus: yes/ BM: no;  Bladder: Voiding: yes; easily Lochia: lighter than period, less than yesterday  Pain: yes; Location: uterine cramps Intensity: moderate Management: ibuprofen  (OTC) and narcotic analgesics including oxycodone  (Oxycontin , Oxyir)   Medications:  . docusate sodium   100 mg Oral BID  . enoxaparin  40 mg Subcutaneous Q24H SCH  . escitalopram  oxalate  10 mg Oral Daily  . ibuprofen   600 mg Oral Q6H  . NIFEdipine  30 mg Oral Daily  . prenatal multivitamin  1 tablet Oral Daily    Physical Examination  Temp:  [98.3 F (36.8 C)-98.6 F (37 C)] 98.3 F (36.8 C) Heart Rate:  [52-80] 52 Resp:  [17-18] 18 BP: (120-134)/(59-79) 120/59 SpO2:  [95 %-99 %] 96 % O2 Device: None (Room air)     Intakes & Outputs (Last 24 hours) at 06/23/2024 0700 Last data filed at 06/22/2024 2017 24hr Volume @0700   Intake 480 mL  Output 2200 mL  Net -1720 mL    Physical Exam General: Alert, oriented, no apparent distress EDPS score: not yet scored Fundus: firm at U - 2 FB Extremities: 2+ pulses; non-tender, edema: none Lungs: Regular rate and  effort, no s/s of respiratory distress  Breast: smooth, round, and soft no engorgement  Nipples: intact bilateral per pt Abd: soft, non-tender, without masses or organomegaly Perineum is normal and not inspected per pt  Delivery: Date of Birth: 06/22/2025  Time of Birth: 2:08 AM   Information for the patient's newborn:  Caley[place], Jr [[id number]]  female Birth History  . Birth    Length: 18.5 (47 cm)    Weight: 2.58 kg (5 lb 11 oz)    HC 32 cm (12.6)  . Apgar    One: 8    Five: 9  . Delivery Method: Vaginal, Spontaneous  . Gestation Age: 55 1/7 wks  . Duration of Labor: 2nd: 20m  . Hospital Name: [facility]  . Hospital Location: [place], [place]    Results  Labs:  No results found for this or any previous visit (from the past 24 hours). Unresulted Labs     Order Current Status   Prepare RBC, 2 Units Preliminary result       Electronically signed: Roe Pinal, CNM 06/23/2024 / 8:57 AM   *Some images could not be shown.
